# Patient Record
Sex: Female | Born: 2000 | Race: Black or African American | Hispanic: No | Marital: Single | Smoking: Never smoker
Health system: Southern US, Community
[De-identification: ages and names within clinical notes are randomized; demographics above are authoritative.]

## PROBLEM LIST (undated history)

## (undated) DIAGNOSIS — N289 Disorder of kidney and ureter, unspecified: Secondary | ICD-10-CM

## (undated) DIAGNOSIS — E119 Type 2 diabetes mellitus without complications: Secondary | ICD-10-CM

## (undated) DIAGNOSIS — N926 Irregular menstruation, unspecified: Secondary | ICD-10-CM

## (undated) HISTORY — PX: HYSTEROSCOPY: SHX211

---

## 2022-01-04 ENCOUNTER — Emergency Department (HOSPITAL_COMMUNITY): Payer: BC Managed Care – PPO

## 2022-01-04 ENCOUNTER — Encounter (HOSPITAL_COMMUNITY): Payer: Self-pay

## 2022-01-04 ENCOUNTER — Emergency Department (HOSPITAL_COMMUNITY)
Admission: EM | Admit: 2022-01-04 | Discharge: 2022-01-04 | Disposition: A | Discharge status: Home / Self Care | Payer: BC Managed Care – PPO | Attending: Emergency Medicine | Admitting: Emergency Medicine

## 2022-01-04 ENCOUNTER — Other Ambulatory Visit: Payer: Self-pay

## 2022-01-04 DIAGNOSIS — R55 Syncope and collapse: Secondary | ICD-10-CM | POA: Diagnosis not present

## 2022-01-04 DIAGNOSIS — R109 Unspecified abdominal pain: Secondary | ICD-10-CM | POA: Diagnosis present

## 2022-01-04 DIAGNOSIS — R112 Nausea with vomiting, unspecified: Secondary | ICD-10-CM | POA: Insufficient documentation

## 2022-01-04 DIAGNOSIS — R1012 Left upper quadrant pain: Secondary | ICD-10-CM | POA: Diagnosis not present

## 2022-01-04 DIAGNOSIS — R1032 Left lower quadrant pain: Secondary | ICD-10-CM | POA: Insufficient documentation

## 2022-01-04 HISTORY — DX: Disorder of kidney and ureter, unspecified: N28.9

## 2022-01-04 HISTORY — DX: Type 2 diabetes mellitus without complications: E11.9

## 2022-01-04 HISTORY — DX: Irregular menstruation, unspecified: N92.6

## 2022-01-04 LAB — BASIC METABOLIC PANEL
Anion gap: 11 (ref 5–15)
BUN: 10 mg/dL (ref 6–20)
CO2: 18 mmol/L — ABNORMAL LOW (ref 22–32)
Calcium: 9.3 mg/dL (ref 8.9–10.3)
Chloride: 106 mmol/L (ref 98–111)
Creatinine, Ser: 0.85 mg/dL (ref 0.44–1.00)
GFR, Estimated: >60 mL/min (ref >60)
Glucose, Bld: 130 mg/dL — ABNORMAL HIGH (ref 70–99)
Potassium: 3.1 mmol/L — ABNORMAL LOW (ref 3.5–5.1)
Sodium: 135 mmol/L (ref 135–145)

## 2022-01-04 LAB — CBC
HCT: 42.5 % (ref 36.0–46.0)
Hemoglobin: 14.1 g/dL (ref 12.0–15.0)
MCH: 31.5 pg (ref 26.0–34.0)
MCHC: 33.2 g/dL (ref 30.0–36.0)
MCV: 95.1 fL (ref 80.0–100.0)
Platelets: 361 10*3/uL (ref 150–400)
RBC: 4.47 MIL/uL (ref 3.87–5.11)
RDW: 12 % (ref 11.5–15.5)
WBC: 11.6 10*3/uL — ABNORMAL HIGH (ref 4.0–10.5)
nRBC: 0 % (ref 0.0–0.2)

## 2022-01-04 LAB — I-STAT BETA HCG BLOOD, ED (MC, WL, AP ONLY): I-stat hCG, quantitative: <5 m[IU]/mL (ref <5)

## 2022-01-04 LAB — URINALYSIS, ROUTINE W REFLEX MICROSCOPIC
Bacteria, UA: NONE SEEN
Bilirubin Urine: NEGATIVE
Glucose, UA: NEGATIVE mg/dL
Ketones, ur: 80 mg/dL — AB
Leukocytes,Ua: NEGATIVE
Nitrite: NEGATIVE
Protein, ur: NEGATIVE mg/dL
Specific Gravity, Urine: >1.046 — ABNORMAL HIGH (ref 1.005–1.030)
pH: 8 (ref 5.0–8.0)

## 2022-01-04 LAB — CBG MONITORING, ED: Glucose-Capillary: 112 mg/dL — ABNORMAL HIGH (ref 70–99)

## 2022-01-04 MED ORDER — METOCLOPRAMIDE HCL 5 MG/ML IJ SOLN
10.0000 mg | Freq: Once | INTRAMUSCULAR | Status: AC
  Administered 2022-01-04: 10 mg via INTRAVENOUS
  Filled 2022-01-04: qty 2

## 2022-01-04 MED ORDER — MORPHINE SULFATE (PF) 4 MG/ML IV SOLN
4.0000 mg | Freq: Once | INTRAVENOUS | Status: AC
  Administered 2022-01-04: 4 mg via INTRAVENOUS
  Filled 2022-01-04: qty 1

## 2022-01-04 MED ORDER — ONDANSETRON 4 MG PO TBDP
4.0000 mg | ORAL_TABLET | Freq: Once | ORAL | Status: AC | PRN
Start: 2022-01-04 — End: 2022-01-04
  Administered 2022-01-04: 4 mg via ORAL
  Filled 2022-01-04: qty 1

## 2022-01-04 MED ORDER — SODIUM CHLORIDE 0.9 % IV BOLUS
1000.0000 mL | Freq: Once | INTRAVENOUS | Status: AC
  Administered 2022-01-04: 1000 mL via INTRAVENOUS

## 2022-01-04 MED ORDER — ONDANSETRON HCL 4 MG PO TABS: 4.0000 mg | ORAL_TABLET | Freq: Three times a day (TID) | ORAL | 0 refills | Status: DC | PRN

## 2022-01-04 MED ORDER — IOHEXOL 300 MG/ML  SOLN
100.0000 mL | Freq: Once | INTRAMUSCULAR | Status: AC | PRN
  Administered 2022-01-04: 100 mL via INTRAVENOUS

## 2022-01-04 NOTE — ED Notes (Signed)
Pt attempted to provide urine sample, but unable to urinate.  ?

## 2022-01-04 NOTE — ED Notes (Signed)
Patient transported to CT 

## 2022-01-04 NOTE — ED Notes (Signed)
Pt ambulatory to restroom. Steady gait. No issues observed.  ?

## 2022-01-04 NOTE — Discharge Instructions (Signed)
You have been seen and discharged from the emergency department.  Your blood work showed mildly elevated white blood cells but otherwise was normal.  Urine was unremarkable.  CT scan of the abdomen pelvis did not show any acute/surgical finding.  You are most likely suffering from a viral stomach bug.  Stable hydrated, use antinausea medicine as needed.  Follow-up with your primary provider for further evaluation and further care. Take home medications as prescribed. If you have any worsening symptoms or further concerns for your health please return to an emergency department for further evaluation. ?

## 2022-01-04 NOTE — ED Provider Triage Note (Signed)
Emergency Medicine Provider Triage Evaluation Note ? ?Courtney Quinn , a 21 y.o. female  was evaluated in triage.  Pt complains of abdominal pain.  The patient states that she has had sharp abdominal pain and right flank pain for the past 5 days.  She endorses nausea and vomiting.  She denies shortness of breath or chest pain.  She denies urinary symptoms.  The patient states that she only has a right kidney ? ?Review of Systems  ?Positive: Abdominal pain, nausea, vomiting ?Negative: Chest pain, shortness of breath ? ?Physical Exam  ?BP 126/80 (BP Location: Left Arm)   Pulse 76   Temp 97.8 ?F (36.6 ?C) (Oral)   Resp 18   Ht 5\' 4"  (1.626 m)   Wt 77.1 kg   LMP 12/30/2021   SpO2 95% Comment: Simultaneous filing. User may not have seen previous data.  BMI 29.18 kg/m?  ?Gen:   Awake, patient actively vomiting/dry heaving ?Resp:  Normal effort  ?MSK:   Moves extremities without difficulty  ?Other:  Right CVA tenderness ? ?Medical Decision Making  ?Medically screening exam initiated at 2:06 PM.  Appropriate orders placed.  Courtney Quinn was informed that the remainder of the evaluation will be completed by another provider, this initial triage assessment does not replace that evaluation, and the importance of remaining in the ED until their evaluation is complete. ? ? ?  ?Dorothyann Peng, PA-C ?01/04/22 1407 ? ?

## 2022-01-04 NOTE — ED Provider Notes (Signed)
?[facility] DEPT ?Provider Note ? ? ?CSN: [id number] ?Arrival date & time: 01/04/22  1334 ? ?  ? ?History ? ?Chief Complaint  ?Patient presents with  ? Abdominal Pain  ? Nausea  ? Loss of Consciousness  ? ? ?Courtney Quinn is a 21 y.o. female. ? ?HPI ? ?21 year old female presents emergency department with mainly left-sided abdominal pain.  Patient states this has been intermittent for the past 5 days, worsening and more severe today.  She is now complaining of nausea/nonbloody vomiting.  Denies any diarrhea.  No shortness of breath or chest pain.  No urinary symptoms.  No reported fever.  She never had symptoms like this in the past.  She is currently on her menstrual cycle which is otherwise been baseline for her. ? ?Home Medications ?Prior to Admission medications   ?Not on File  ?   ? ?Allergies    ?Patient has no known allergies.   ? ?Review of Systems   ?Review of Systems  ?Constitutional:  Positive for appetite change and fatigue. Negative for fever.  ?Respiratory:  Negative for shortness of breath.   ?Cardiovascular:  Negative for chest pain.  ?Gastrointestinal:  Positive for abdominal pain, nausea and vomiting. Negative for diarrhea.  ?Genitourinary:  Negative for dysuria and menstrual problem.  ?Skin:  Negative for rash.  ?Neurological:  Negative for headaches.  ? ?Physical Exam ?Updated Vital Signs ?BP 132/65   Pulse 60   Temp 97.8 ?F (36.6 ?C)   Resp (!) 25   Ht 5\' 4"  (1.626 m)   Wt 77.1 kg   LMP 12/30/2021   SpO2 100%   BMI 29.18 kg/m?  ?Physical Exam ?Vitals and nursing note reviewed.  ?Constitutional:   ?   General: She is not in acute distress. ?   Appearance: Normal appearance.  ?HENT:  ?   Head: Normocephalic.  ?   Mouth/Throat:  ?   Mouth: Mucous membranes are moist.  ?Cardiovascular:  ?   Rate and Rhythm: Normal rate.  ?Pulmonary:  ?   Effort: Pulmonary effort is normal. No respiratory distress.  ?Abdominal:  ?   General: Bowel sounds are normal. There  is no distension.  ?   Palpations: Abdomen is soft.  ?   Tenderness: There is abdominal tenderness in the periumbilical area, left upper quadrant and left lower quadrant. There is no guarding or rebound.  ?Skin: ?   General: Skin is warm.  ?Neurological:  ?   Mental Status: She is alert and oriented to person, place, and time. Mental status is at baseline.  ?Psychiatric:     ?   Mood and Affect: Mood normal.  ? ? ?ED Results / Procedures / Treatments   ?Labs ?(all labs ordered are listed, but only abnormal results are displayed) ?Labs Reviewed  ?BASIC METABOLIC PANEL - Abnormal; Notable for the following components:  ?    Result Value  ? Potassium 3.1 (*)   ? CO2 18 (*)   ? Glucose, Bld 130 (*)   ? All other components within normal limits  ?CBC - Abnormal; Notable for the following components:  ? WBC 11.6 (*)   ? All other components within normal limits  ?CBG MONITORING, ED - Abnormal; Notable for the following components:  ? Glucose-Capillary 112 (*)   ? All other components within normal limits  ?URINALYSIS, ROUTINE W REFLEX MICROSCOPIC  ?I-STAT BETA HCG BLOOD, ED ([facility], [facility], [facility] ONLY)  ? ? ?EKG ?EKG Interpretation ? ?Date/Time:  Friday January 04 2022 14:04:06 EDT ?Ventricular Rate:  79 ?PR Interval:  196 ?QRS Duration: 103 ?QT Interval:  386 ?QTC Calculation: 443 ?R Axis:   64 ?Text Interpretation: Sinus rhythm Borderline T abnormalities, anterior leads Confirmed by Lavenia Atlas [phone]) on 01/04/2022 3:49:32 PM ? ?Radiology ?No results found. ? ?Procedures ?Procedures  ? ? ?Medications Ordered in ED ?Medications  ?ondansetron (ZOFRAN-ODT) disintegrating tablet 4 mg (4 mg Oral Given 01/04/22 1405)  ?sodium chloride 0.9 % bolus 1,000 mL (1,000 mLs Intravenous New Bag/Given 01/04/22 1555)  ?morphine (PF) 4 MG/ML injection 4 mg (4 mg Intravenous Given 01/04/22 1554)  ?metoCLOPramide (REGLAN) injection 10 mg (10 mg Intravenous Given 01/04/22 1646)  ?iohexol (OMNIPAQUE) 300 MG/ML solution 100 mL (100 mLs Intravenous Contrast  Given 01/04/22 1700)  ? ? ?ED Course/ Medical Decision Making/ A&P ?  ?                        ?Medical Decision Making ?Amount and/or Complexity of Data Reviewed ?Labs: ordered. ?Radiology: ordered. ? ?Risk ?Prescription drug management. ? ? ?21 year old female presents emergency department abdominal pain, nausea/vomiting.  Vitals are normal on arrival, abdomen is tender mainly on the left side but benign.  Blood work shows a white blood cell count of 11.6, mild hypokalemia, urinalysis with blood in it most likely secondary to menstruation. ? ?CT of the abdomen pelvis identifies no acute pathology, known single right sided kidney with no abnormal findings.  After IV medication patient's symptoms have resolved, she been able to p.o.  Plan for outpatient symptomatic treatment and follow-up.  Patient at this time appears safe and stable for discharge and close outpatient follow up. Discharge plan and strict return to ED precautions discussed, patient verbalizes understanding and agreement. ? ? ? ? ? ? ? ?Final Clinical Impression(s) / ED Diagnoses ?Final diagnoses:  ?None  ? ? ?Rx / DC Orders ?ED Discharge Orders   ? ? None  ? ?  ? ? ?  ?Lorelle Gibbs, DO ?01/04/22 2011 ? ?

## 2022-01-04 NOTE — ED Triage Notes (Signed)
Per EMS- patient reports abdominal pain, nausea, and a syncopal episode today. Patient also reports that she is on her period. ?

## 2022-02-12 ENCOUNTER — Encounter (HOSPITAL_COMMUNITY): Payer: Self-pay

## 2022-02-12 ENCOUNTER — Emergency Department (HOSPITAL_COMMUNITY)
Admission: EM | Admit: 2022-02-12 | Discharge: 2022-02-13 | Disposition: A | Discharge status: Home / Self Care | Payer: BC Managed Care – PPO | Attending: Emergency Medicine | Admitting: Emergency Medicine

## 2022-02-12 ENCOUNTER — Other Ambulatory Visit: Payer: Self-pay

## 2022-02-12 DIAGNOSIS — D72829 Elevated white blood cell count, unspecified: Secondary | ICD-10-CM | POA: Insufficient documentation

## 2022-02-12 DIAGNOSIS — E119 Type 2 diabetes mellitus without complications: Secondary | ICD-10-CM | POA: Diagnosis not present

## 2022-02-12 DIAGNOSIS — R1030 Lower abdominal pain, unspecified: Secondary | ICD-10-CM | POA: Diagnosis present

## 2022-02-12 DIAGNOSIS — N390 Urinary tract infection, site not specified: Secondary | ICD-10-CM | POA: Diagnosis not present

## 2022-02-12 LAB — URINALYSIS, ROUTINE W REFLEX MICROSCOPIC
Bilirubin Urine: NEGATIVE
Glucose, UA: NEGATIVE mg/dL
Ketones, ur: NEGATIVE mg/dL
Nitrite: NEGATIVE
Protein, ur: 100 mg/dL — AB
RBC / HPF: >50 RBC/hpf — ABNORMAL HIGH (ref 0–5)
Specific Gravity, Urine: 1.018 (ref 1.005–1.030)
WBC, UA: >50 WBC/hpf — ABNORMAL HIGH (ref 0–5)
pH: 7 (ref 5.0–8.0)

## 2022-02-12 LAB — PREGNANCY, URINE: Preg Test, Ur: NEGATIVE

## 2022-02-12 NOTE — ED Provider Notes (Signed)
?[facility] DEPT ?Provider Note ? ? ?CSN: [id number] ?Arrival date & time: 02/12/22  2206 ? ?  ? ?History ? ?Chief Complaint  ?Patient presents with  ? Hematuria  ? burning with urination  ? ? ?Courtney Quinn is a 21 y.o. female. ? ?Courtney Quinn is a 21 y.o. female with hx of DM and congenital solitary kidney, who presents for evaluation of dysuria, urinary frequency, hematuria, lower abdominal and flank pain.  She reports that symptoms started yesterday and have progressively worsened throughout the day.  She reports blood in her urine and burning and pain with urination and that she has had very frequent urination.  Reports some suprapubic abdominal pain that radiates to her back as well as some mild right-sided flank pain.  She has a solitary right kidney.  Denies any prior history of kidney stones.  Reports some associated nausea but no vomiting.  No known fevers.  No meds prior to arrival. ? ?The history is provided by the patient.  ?Hematuria ?Associated symptoms include abdominal pain. Pertinent negatives include no chest pain and no shortness of breath.  ? ?  ? ?Home Medications ?Prior to Admission medications   ?Medication Sig Start Date End Date Taking? Authorizing Provider  ?cephALEXin (KEFLEX) 500 MG capsule Take 1 capsule (500 mg total) by mouth 4 (four) times daily. 02/13/22  Yes Jacqlyn Larsen, PA-C  ?ondansetron (ZOFRAN) 4 MG tablet Take 1 tablet (4 mg total) by mouth every 6 (six) hours. 02/13/22  Yes Jacqlyn Larsen, PA-C  ?   ? ?Allergies    ?Patient has no known allergies.   ? ?Review of Systems   ?Review of Systems  ?Constitutional:  Positive for chills. Negative for fever.  ?HENT: Negative.    ?Respiratory:  Negative for cough and shortness of breath.   ?Cardiovascular:  Negative for chest pain.  ?Gastrointestinal:  Positive for abdominal pain and nausea. Negative for diarrhea and vomiting.  ?Genitourinary:  Positive for dysuria, flank pain, frequency and  hematuria. Negative for vaginal bleeding and vaginal discharge.  ?Musculoskeletal:  Negative for arthralgias and myalgias.  ?Skin:  Negative for color change and rash.  ?Neurological:  Negative for dizziness, syncope and light-headedness.  ?All other systems reviewed and are negative. ? ?Physical Exam ?Updated Vital Signs ?BP 120/87 (BP Location: Right Arm)   Pulse (!) 56   Temp 98.2 ?F (36.8 ?C) (Oral)   Resp 18   Ht 5\' 4"  (1.626 m)   Wt 77.1 kg   SpO2 100%   BMI 29.18 kg/m?  ?Physical Exam ?Vitals and nursing note reviewed.  ?Constitutional:   ?   General: She is not in acute distress. ?   Appearance: Normal appearance. She is well-developed and normal weight. She is not ill-appearing or diaphoretic.  ?HENT:  ?   Head: Normocephalic and atraumatic.  ?Eyes:  ?   General:     ?   Right eye: No discharge.     ?   Left eye: No discharge.  ?Cardiovascular:  ?   Rate and Rhythm: Normal rate and regular rhythm.  ?   Pulses: Normal pulses.  ?   Heart sounds: Normal heart sounds.  ?Pulmonary:  ?   Effort: Pulmonary effort is normal. No respiratory distress.  ?   Breath sounds: Normal breath sounds. No wheezing or rales.  ?   Comments: Respirations equal and unlabored, patient able to speak in full sentences, lungs clear to auscultation bilaterally  ?Abdominal:  ?  General: Bowel sounds are normal. There is no distension.  ?   Palpations: Abdomen is soft. There is no mass.  ?   Tenderness: There is abdominal tenderness. There is no guarding.  ?   Comments: Abdomen soft, nondistended, bowel sounds present, there is a very mild suprapubic tenderness but otherwise no acute abdominal tenderness, no guarding or rebound tenderness, patient with very mild right flank tenderness but no significant CVA tenderness  ?Musculoskeletal:     ?   General: No deformity.  ?   Cervical back: Neck supple.  ?Skin: ?   General: Skin is warm and dry.  ?   Capillary Refill: Capillary refill takes less than 2 seconds.  ?Neurological:  ?    Mental Status: She is alert and oriented to person, place, and time.  ?   Coordination: Coordination normal.  ?   Comments: Speech is clear, able to follow commands ?Moves extremities without ataxia, coordination intact  ?Psychiatric:     ?   Mood and Affect: Mood normal.     ?   Behavior: Behavior normal.  ? ? ?ED Results / Procedures / Treatments   ?Labs ?(all labs ordered are listed, but only abnormal results are displayed) ?Labs Reviewed  ?URINALYSIS, ROUTINE W REFLEX MICROSCOPIC - Abnormal; Notable for the following components:  ?    Result Value  ? APPearance CLOUDY (*)   ? Hgb urine dipstick LARGE (*)   ? Protein, ur 100 (*)   ? Leukocytes,Ua LARGE (*)   ? RBC / HPF >50 (*)   ? WBC, UA >50 (*)   ? Bacteria, UA MANY (*)   ? All other components within normal limits  ?CBC WITH DIFFERENTIAL/PLATELET - Abnormal; Notable for the following components:  ? WBC 14.7 (*)   ? Neutro Abs 8.8 (*)   ? Lymphs Abs 4.4 (*)   ? All other components within normal limits  ?URINE CULTURE  ?PREGNANCY, URINE  ?BASIC METABOLIC PANEL  ?POC URINE PREG, ED  ? ? ?EKG ?None ? ?Radiology ?CT Renal Stone Study ? ?Result Date: 02/13/2022 ?CLINICAL DATA:  Low back pain, right flank pain, dysuria EXAM: CT ABDOMEN AND PELVIS WITHOUT CONTRAST TECHNIQUE: Multidetector CT imaging of the abdomen and pelvis was performed following the standard protocol without IV contrast. RADIATION DOSE REDUCTION: This exam was performed according to the departmental dose-optimization program which includes automated exposure control, adjustment of the mA and/or kV according to patient size and/or use of iterative reconstruction technique. COMPARISON:  01/04/2022 FINDINGS: Lower chest: No acute abnormality. Hepatobiliary: Visualized liver is unremarkable. Gallbladder unremarkable. No intra or extrahepatic biliary ductal dilation. Pancreas: Unremarkable. Spleen: Visualized spleen is unremarkable. Adrenals/Urinary Tract: The adrenal glands are unremarkable. Right  kidney is unremarkable. Left kidney is absent. Bladder is unremarkable. Stomach/Bowel: Stomach is within normal limits. Appendix appears normal. No evidence of bowel wall thickening, distention, or inflammatory changes. Vascular/Lymphatic: No significant vascular findings are present. No enlarged abdominal or pelvic lymph nodes. 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 No adnexal masses. Other: No abdominal wall hernia or abnormality. No abdominopelvic ascites. Musculoskeletal: No acute bone abnormality. No lytic or blastic bone lesion. IMPRESSION: No acute intra-abdominal pathology identified. No definite radiographic explanation for the patient's reported symptoms. Absence of the left kidney again noted. Bike are no it appearance of the uterus. 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 Electronically Signed   By: Fidela Salisbury M.D.   On: 02/13/2022 01:24   ? ?Procedures ?Procedures  ? ? ?Medications Ordered in ED ?Medications  ?sodium chloride 0.9 % bolus 1,000 mL (0 mLs Intravenous Stopped 02/13/22 0230)  ?ondansetron [facility]) injection 4 mg (4 mg Intravenous Given 02/13/22 0019)  ?acetaminophen (TYLENOL) tablet 1,000 mg (1,000 mg Oral Given 02/13/22 0020)  ?cefTRIAXone (ROCEPHIN) 1 g in sodium chloride 0.9 % 100 mL IVPB (0 g Intravenous Stopped 02/13/22 0051)  ? ? ?ED Course/ Medical Decision Making/ A&P ?  ?                        ?Medical Decision Making ?Amount and/or Complexity of Data Reviewed ?Labs: ordered. ?Radiology: ordered. ? ?Risk ?OTC drugs. ?Prescription drug management. ? ? ?21 y.o. female presents to the ED with complaints of dysuria, frequency, hematuria, abdominal and flank pain, this involves an extensive number of treatment options, and is a complaint  that carries with it a high risk of complications and morbidity.  The differential diagnosis includes UTI, pyelonephritis, nephrolithiasis, STD  ?On arrival pt is nontoxic, vitals without significant abnormality

## 2022-02-12 NOTE — ED Triage Notes (Signed)
Pt reports with burning with urination, hematuria, lower abdominal pain, and back pain since yesterday.  ?

## 2022-02-12 NOTE — ED Notes (Signed)
Lab called to add pregnancy urine. ?

## 2022-02-13 ENCOUNTER — Emergency Department (HOSPITAL_COMMUNITY): Payer: BC Managed Care – PPO

## 2022-02-13 LAB — BASIC METABOLIC PANEL
Anion gap: 5 (ref 5–15)
BUN: 10 mg/dL (ref 6–20)
CO2: 26 mmol/L (ref 22–32)
Calcium: 8.9 mg/dL (ref 8.9–10.3)
Chloride: 105 mmol/L (ref 98–111)
Creatinine, Ser: 0.83 mg/dL (ref 0.44–1.00)
GFR, Estimated: >60 mL/min (ref >60)
Glucose, Bld: 82 mg/dL (ref 70–99)
Potassium: 4.2 mmol/L (ref 3.5–5.1)
Sodium: 136 mmol/L (ref 135–145)

## 2022-02-13 LAB — CBC WITH DIFFERENTIAL/PLATELET
Abs Immature Granulocytes: 0.04 10*3/uL (ref 0.00–0.07)
Basophils Absolute: 0.1 10*3/uL (ref 0.0–0.1)
Basophils Relative: 0 %
Eosinophils Absolute: 0.4 10*3/uL (ref 0.0–0.5)
Eosinophils Relative: 3 %
HCT: 39.2 % (ref 36.0–46.0)
Hemoglobin: 12.4 g/dL (ref 12.0–15.0)
Immature Granulocytes: 0 %
Lymphocytes Relative: 30 %
Lymphs Abs: 4.4 10*3/uL — ABNORMAL HIGH (ref 0.7–4.0)
MCH: 30.9 pg (ref 26.0–34.0)
MCHC: 31.6 g/dL (ref 30.0–36.0)
MCV: 97.8 fL (ref 80.0–100.0)
Monocytes Absolute: 1 10*3/uL (ref 0.1–1.0)
Monocytes Relative: 7 %
Neutro Abs: 8.8 10*3/uL — ABNORMAL HIGH (ref 1.7–7.7)
Neutrophils Relative %: 60 %
Platelets: 300 10*3/uL (ref 150–400)
RBC: 4.01 MIL/uL (ref 3.87–5.11)
RDW: 12.2 % (ref 11.5–15.5)
WBC: 14.7 10*3/uL — ABNORMAL HIGH (ref 4.0–10.5)
nRBC: 0 % (ref 0.0–0.2)

## 2022-02-13 MED ORDER — ACETAMINOPHEN 500 MG PO TABS
1000.0000 mg | ORAL_TABLET | Freq: Once | ORAL | Status: AC
  Administered 2022-02-13: 1000 mg via ORAL
  Filled 2022-02-13: qty 2

## 2022-02-13 MED ORDER — ONDANSETRON HCL 4 MG PO TABS: 4.0000 mg | ORAL_TABLET | Freq: Four times a day (QID) | ORAL | 0 refills | Status: AC

## 2022-02-13 MED ORDER — CEPHALEXIN 500 MG PO CAPS: 500.0000 mg | ORAL_CAPSULE | Freq: Four times a day (QID) | ORAL | 0 refills | Status: DC

## 2022-02-13 MED ORDER — SODIUM CHLORIDE 0.9 % IV SOLN
1.0000 g | Freq: Once | INTRAVENOUS | Status: AC
  Administered 2022-02-13: 1 g via INTRAVENOUS
  Filled 2022-02-13: qty 10

## 2022-02-13 MED ORDER — SODIUM CHLORIDE 0.9 % IV BOLUS
1000.0000 mL | Freq: Once | INTRAVENOUS | Status: AC
  Administered 2022-02-13: 1000 mL via INTRAVENOUS

## 2022-02-13 MED ORDER — ONDANSETRON HCL 4 MG/2ML IJ SOLN
4.0000 mg | Freq: Once | INTRAMUSCULAR | Status: AC
  Administered 2022-02-13: 4 mg via INTRAVENOUS
  Filled 2022-02-13: qty 2

## 2022-02-13 NOTE — ED Notes (Signed)
Discharge instructions reviewed, questions answered. Rx education provided. Pt states understanding and no further questions. Pt ambulatory with steady gait upon discharge. No s/s of distress noted. ? ?

## 2022-02-13 NOTE — Discharge Instructions (Addendum)
You have a urinary tract infection, please take prescribed antibiotics 4 times daily for the next 7 days.  It is very important that you complete entire course of antibiotics even if your symptoms are improving.  You may use prescribed Zofran as needed for nausea or vomiting.  For pain you can take Tylenol and/or ibuprofen every 6 hours as needed. ? ?If you develop fevers, persistent vomiting and cannot keep down antibiotics or worsening pain you should return for reevaluation. ?

## 2022-02-13 NOTE — ED Notes (Signed)
Lab called for urine culture ?

## 2022-02-15 LAB — URINE CULTURE: Culture: >=100000 — AB

## 2022-02-16 ENCOUNTER — Telehealth (HOSPITAL_BASED_OUTPATIENT_CLINIC_OR_DEPARTMENT_OTHER): Payer: Self-pay | Admitting: *Deleted

## 2022-02-16 NOTE — Telephone Encounter (Signed)
Post ED Visit - Positive Culture Follow-up ? ?Culture report reviewed by antimicrobial stewardship pharmacist: ?[facility] ?[]  , Pharm.D. ?[]  Enzo Bi, Pharm.D., BCPS AQ-ID ?[]  , Pharm.D., BCPS ?[]  Celedonio Miyamoto, Pharm.D., BCPS ?[]  [place], Garvin Fila.D., BCPS, AAHIVP ?[]  , Pharm.D., BCPS, AAHIVP ?[]  Georgina Pillion, PharmD, BCPS ?[]  , PharmD, BCPS ?[]  [place], PharmD, BCPS ?[]  [address], PharmD ?[]  , PharmD, BCPS ?[]  Estella Husk, PharmD ? ? [facility] ?[]  Lysle Pearl, PharmD ?[]  , PharmD ?[]  Phillips Climes, PharmD ?[]  , Rph ?[]  Agapito Games) , PharmD ?[]  Verlan Friends, PharmD ?[]  , PharmD ?[]  Mervyn Gay, PharmD ?[]  , PharmD ?[]  Vinnie Level, PharmD ?[x]  Gerri Spore, PharmD ?[]  , PharmD ?[]  Len Childs, PharmD ? ? ?Positive urine culture ?Treated with Cephalexin organism sensitive to the same and no further patient follow-up is required at this time. ? ? ?02/16/2022, 12:12 PM ?  ?

## 2022-02-21 ENCOUNTER — Encounter (HOSPITAL_COMMUNITY): Payer: Self-pay

## 2022-02-21 ENCOUNTER — Other Ambulatory Visit: Payer: Self-pay

## 2022-02-21 ENCOUNTER — Emergency Department (HOSPITAL_COMMUNITY)
Admission: EM | Admit: 2022-02-21 | Discharge: 2022-02-22 | Disposition: A | Discharge status: Home / Self Care | Payer: BC Managed Care – PPO | Attending: Emergency Medicine | Admitting: Emergency Medicine

## 2022-02-21 DIAGNOSIS — R1031 Right lower quadrant pain: Secondary | ICD-10-CM | POA: Diagnosis present

## 2022-02-21 DIAGNOSIS — R1030 Lower abdominal pain, unspecified: Secondary | ICD-10-CM

## 2022-02-21 DIAGNOSIS — R1032 Left lower quadrant pain: Secondary | ICD-10-CM | POA: Diagnosis not present

## 2022-02-21 DIAGNOSIS — E119 Type 2 diabetes mellitus without complications: Secondary | ICD-10-CM | POA: Diagnosis not present

## 2022-02-21 DIAGNOSIS — Z87718 Personal history of other specified (corrected) congenital malformations of genitourinary system: Secondary | ICD-10-CM

## 2022-02-21 LAB — COMPREHENSIVE METABOLIC PANEL
ALT: 16 U/L (ref 0–44)
AST: 20 U/L (ref 15–41)
Albumin: 4 g/dL (ref 3.5–5.0)
Alkaline Phosphatase: 43 U/L (ref 38–126)
Anion gap: 4 — ABNORMAL LOW (ref 5–15)
BUN: 9 mg/dL (ref 6–20)
CO2: 24 mmol/L (ref 22–32)
Calcium: 8.8 mg/dL — ABNORMAL LOW (ref 8.9–10.3)
Chloride: 109 mmol/L (ref 98–111)
Creatinine, Ser: 0.93 mg/dL (ref 0.44–1.00)
GFR, Estimated: >60 mL/min (ref >60)
Glucose, Bld: 96 mg/dL (ref 70–99)
Potassium: 3.5 mmol/L (ref 3.5–5.1)
Sodium: 137 mmol/L (ref 135–145)
Total Bilirubin: 0.7 mg/dL (ref 0.3–1.2)
Total Protein: 7.8 g/dL (ref 6.5–8.1)

## 2022-02-21 LAB — CBG MONITORING, ED: Glucose-Capillary: 88 mg/dL (ref 70–99)

## 2022-02-21 LAB — CBC
HCT: 38.8 % (ref 36.0–46.0)
Hemoglobin: 12.7 g/dL (ref 12.0–15.0)
MCH: 31.1 pg (ref 26.0–34.0)
MCHC: 32.7 g/dL (ref 30.0–36.0)
MCV: 95.1 fL (ref 80.0–100.0)
Platelets: 340 10*3/uL (ref 150–400)
RBC: 4.08 MIL/uL (ref 3.87–5.11)
RDW: 12 % (ref 11.5–15.5)
WBC: 10.1 10*3/uL (ref 4.0–10.5)
nRBC: 0 % (ref 0.0–0.2)

## 2022-02-21 LAB — LIPASE, BLOOD: Lipase: 37 U/L (ref 11–51)

## 2022-02-21 LAB — I-STAT BETA HCG BLOOD, ED (MC, WL, AP ONLY): I-stat hCG, quantitative: <5 m[IU]/mL (ref <5)

## 2022-02-21 NOTE — ED Triage Notes (Signed)
Patient said she has been nauseous for a month and vomiting all week. Is not sure if she is pregnant. Her pain is all over the stomach but sometimes to the left side. Type 1 diabetic. Said her last sugar was 74.

## 2022-02-21 NOTE — ED Provider Triage Note (Signed)
Emergency Medicine Provider Triage Evaluation Note  Courtney Quinn , a 21 y.o. female  was evaluated in triage.  Pt complains of nausea and abdominal pain over the last month.  Last week and vomiting as well.  Pain is sharp and comes and goes.  Denies urinary symptoms, changes in her bowel movements, back pain, Fever, diarrhea, headache, vision changes, recent illness contact with persons with similar symptoms.  LMP 3 weeks ago.  Recently had sexual intercourse without protection.  Currently on intimate partner.  No Hx of pregnancy or abdominal surgeries.  Hx of diabetes mellitus type 2 and D&C within the last 3 years.  Review of Systems  Positive:  Negative: As above  Physical Exam  BP (!) 119/101 (BP Location: Left Arm)   Pulse 86   Temp 98.2 F (36.8 C) (Oral)   Resp 16   Ht 5\' 4"  (1.626 m)   Wt 75.8 kg   SpO2 100%   BMI 28.67 kg/m  Gen:   Awake, no distress, anxious appearing Resp:  Normal effort, CTAB MSK:   Moves extremities without difficulty  Other:  Mild RLQ tenderness, moderate suprapubic tenderness.  Abdomen soft.  Upper extremities neurovascularly intact.  No active vomiting.  Medical Decision Making  Medically screening exam initiated at 8:42 PM.  Appropriate orders placed.  Courtney Quinn was informed that the remainder of the evaluation will be completed by another provider, this initial triage assessment does not replace that evaluation, and the importance of remaining in the ED until their evaluation is complete.  Labs ordered   , Courtney Quinn 02/21/22 2053

## 2022-02-22 ENCOUNTER — Encounter (HOSPITAL_COMMUNITY): Payer: Self-pay

## 2022-02-22 ENCOUNTER — Emergency Department (HOSPITAL_COMMUNITY): Payer: BC Managed Care – PPO

## 2022-02-22 MED ORDER — SODIUM CHLORIDE 0.9 % IV BOLUS
1000.0000 mL | Freq: Once | INTRAVENOUS | Status: AC
  Administered 2022-02-22: 1000 mL via INTRAVENOUS

## 2022-02-22 MED ORDER — ONDANSETRON HCL 4 MG/2ML IJ SOLN
4.0000 mg | Freq: Once | INTRAMUSCULAR | Status: AC
  Administered 2022-02-22: 4 mg via INTRAVENOUS
  Filled 2022-02-22: qty 2

## 2022-02-22 MED ORDER — KETOROLAC TROMETHAMINE 30 MG/ML IJ SOLN
30.0000 mg | Freq: Once | INTRAMUSCULAR | Status: AC
  Administered 2022-02-22: 30 mg via INTRAVENOUS
  Filled 2022-02-22: qty 1

## 2022-02-22 MED ORDER — TRAMADOL HCL 50 MG PO TABS: 50.0000 mg | ORAL_TABLET | Freq: Four times a day (QID) | ORAL | 0 refills | Status: DC | PRN

## 2022-02-22 MED ORDER — IOHEXOL 300 MG/ML  SOLN
100.0000 mL | Freq: Once | INTRAMUSCULAR | Status: AC | PRN
  Administered 2022-02-22: 100 mL via INTRAVENOUS

## 2022-02-22 MED ORDER — ONDANSETRON 8 MG PO TBDP: ORAL_TABLET | ORAL | 0 refills | Status: AC

## 2022-02-22 NOTE — ED Provider Notes (Signed)
[facility]-EMERGENCY DEPT Provider Note   CSN: [id number] Arrival date & time: 02/21/22  1925     History  Chief Complaint  Patient presents with   Abdominal Pain    Courtney Quinn is a 21 y.o. female.  Patient is a 21 year old female with past medical history of diabetes, bicornuate uterus.  Patient presenting today with complaints of lower abdominal pain.  She reports feeling nauseated for the past several weeks, then began having lower abdominal pain 1 week ago.  She describes intermittent crampy pain to the lower abdomen.  She denies having any fevers or chills.  She denies any abnormal bleeding, discharge, or urinary complaints.  Pain is worse with movement and palpation.  There are no alleviating factors.  The history is provided by the patient.      Home Medications Prior to Admission medications   Medication Sig Start Date End Date Taking? Authorizing Provider  cephALEXin (KEFLEX) 500 MG capsule Take 1 capsule (500 mg total) by mouth 4 (four) times daily. 02/13/22   Dartha Lodge, PA-C  ondansetron (ZOFRAN) 4 MG tablet Take 1 tablet (4 mg total) by mouth every 6 (six) hours. 02/13/22   Dartha Lodge, PA-C      Allergies    Patient has no known allergies.    Review of Systems   Review of Systems  All other systems reviewed and are negative.  Physical Exam Updated Vital Signs BP (!) 121/95 (BP Location: Left Arm)   Pulse 78   Temp 98 F (36.7 C)   Resp 12   Ht 5\' 4"  (1.626 m)   Wt 75.8 kg   SpO2 100%   BMI 28.67 kg/m  Physical Exam Vitals and nursing note reviewed.  Constitutional:      General: She is not in acute distress.    Appearance: She is well-developed. She is not diaphoretic.  HENT:     Head: Normocephalic and atraumatic.  Cardiovascular:     Rate and Rhythm: Normal rate and regular rhythm.     Heart sounds: No murmur heard.   No friction rub. No gallop.  Pulmonary:     Effort: Pulmonary effort is normal. No  respiratory distress.     Breath sounds: Normal breath sounds. No wheezing.  Abdominal:     General: Bowel sounds are normal. There is no distension.     Palpations: Abdomen is soft.     Tenderness: There is abdominal tenderness in the right lower quadrant, suprapubic area and left lower quadrant. There is no right CVA tenderness, left CVA tenderness, guarding or rebound.  Musculoskeletal:        General: Normal range of motion.     Cervical back: Normal range of motion and neck supple.  Skin:    General: Skin is warm and dry.  Neurological:     General: No focal deficit present.     Mental Status: She is alert and oriented to person, place, and time.    ED Results / Procedures / Treatments   Labs (all labs ordered are listed, but only abnormal results are displayed) Labs Reviewed  COMPREHENSIVE METABOLIC PANEL - Abnormal; Notable for the following components:      Result Value   Calcium 8.8 (*)    Anion gap 4 (*)    All other components within normal limits  LIPASE, BLOOD  CBC  URINALYSIS, ROUTINE W REFLEX MICROSCOPIC  I-STAT BETA HCG BLOOD, ED ([facility], [facility], [facility] ONLY)  CBG MONITORING, ED  EKG None  Radiology No results found.  Procedures Procedures    Medications Ordered in ED Medications  sodium chloride 0.9 % bolus 1,000 mL (has no administration in time range)  ketorolac (TORADOL) 30 MG/ML injection 30 mg (has no administration in time range)  ondansetron (ZOFRAN) injection 4 mg (has no administration in time range)    ED Course/ Medical Decision Making/ A&P  Patient presenting here with complaints of abdominal pain as described in the HPI.  I am uncertain as to the etiology of this pain, however nothing appears emergent.  Laboratory studies are reassuring and CT scan shows no acute intra-abdominal process.  Patient given IV fluids along with Toradol and Zofran and seems to be feeling better.  Patient seems appropriate for outpatient follow-up with her GYN and as  needed return.  Final Clinical Impression(s) / ED Diagnoses Final diagnoses:  None    Rx / DC Orders ED Discharge Orders     None         Geoffery Lyons, MD 02/22/22 [phone]

## 2022-02-22 NOTE — Discharge Instructions (Signed)
Follow-up with your GYN if not feeling better in the next few days.  Take tramadol as prescribed as needed for pain and Zofran as prescribed as needed for nausea.  Return to the emergency department if symptoms significantly worsen or change.

## 2022-04-03 ENCOUNTER — Other Ambulatory Visit: Payer: Self-pay | Admitting: Obstetrics and Gynecology

## 2022-06-05 ENCOUNTER — Encounter (HOSPITAL_BASED_OUTPATIENT_CLINIC_OR_DEPARTMENT_OTHER): Payer: Self-pay

## 2022-06-05 ENCOUNTER — Ambulatory Visit (HOSPITAL_BASED_OUTPATIENT_CLINIC_OR_DEPARTMENT_OTHER): Admit: 2022-06-05 | Payer: Self-pay | Admitting: Obstetrics and Gynecology

## 2022-06-05 SURGERY — EXCISION, ENDOMETRIOSIS, LAPAROSCOPIC
Anesthesia: General

## 2022-10-16 IMAGING — CT CT RENAL STONE PROTOCOL
2 of 4 series · 16 of 46 positions shown, 18 images · non-contrast
Comparison: 01/04/2022

CLINICAL DATA: Low back pain, right flank pain, dysuria



[Series 2: axial st · axial · 0.64mm/px · z∈[-434,-88]mm · 13 of 77 slices shown, 15 images]
[im 4/77  soft-tissue]
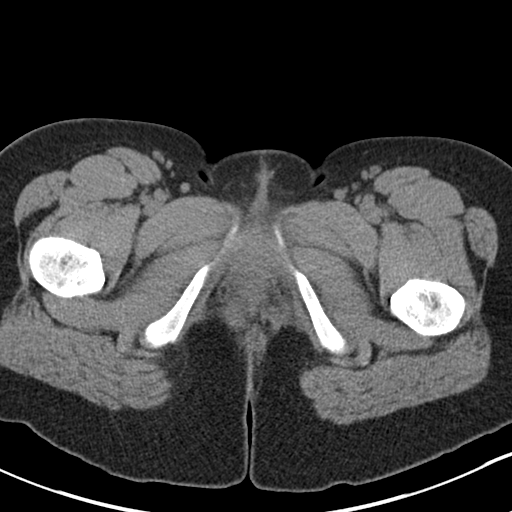
[im 4/77  bone]
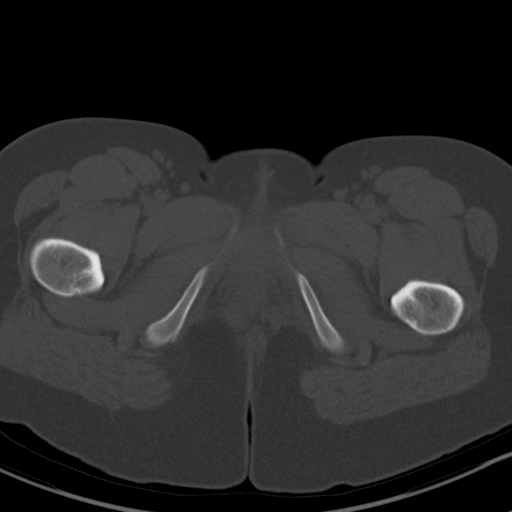
[im 10/77  soft-tissue]
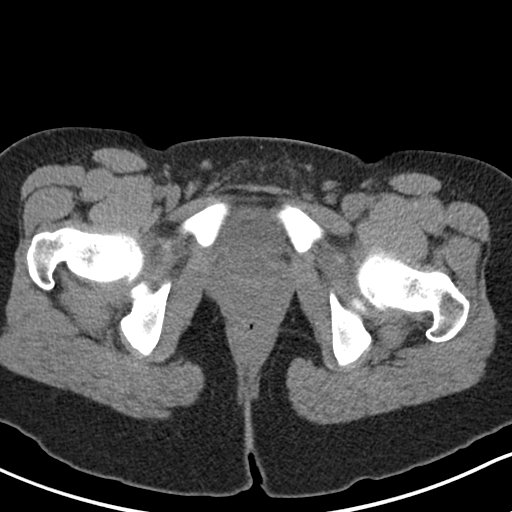
[im 16/77  soft-tissue]
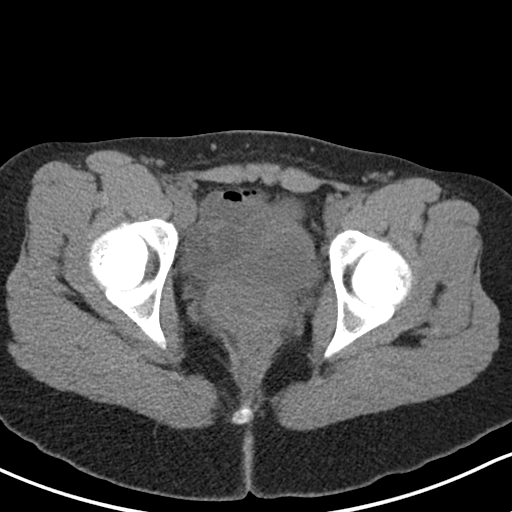
[im 23/77  soft-tissue]
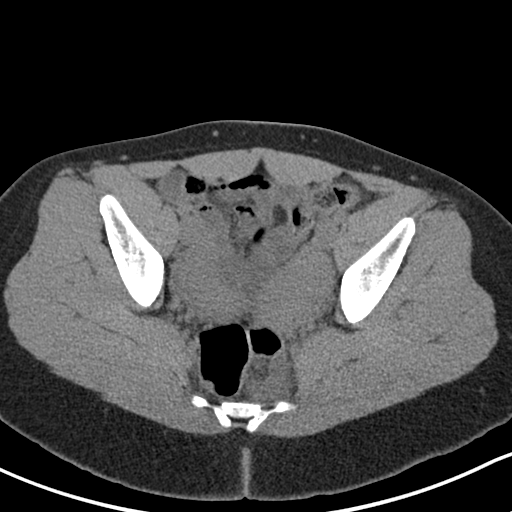
[im 26/77  soft-tissue]
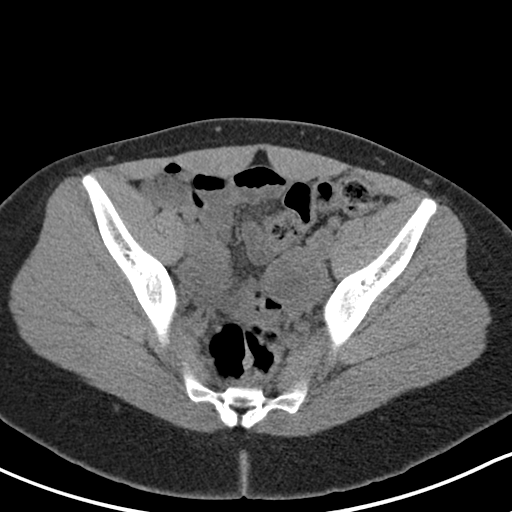
[im 32/77  soft-tissue]
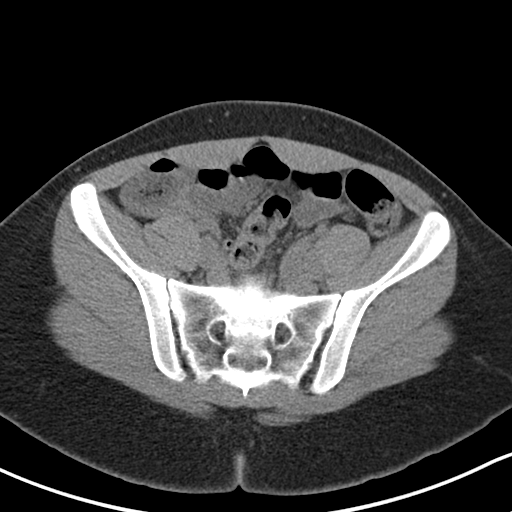
[im 39/77  soft-tissue]
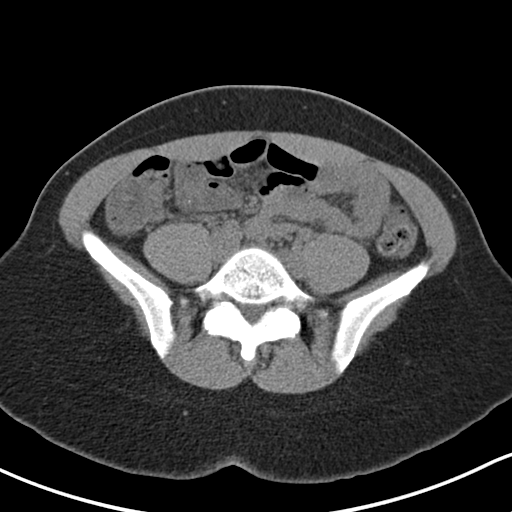
[im 45/77  soft-tissue]
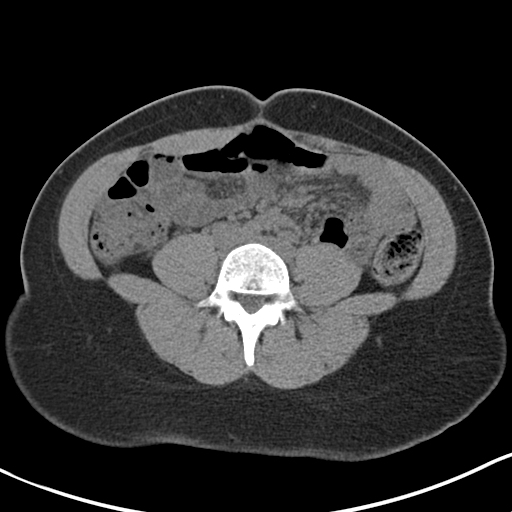
[im 51/77  soft-tissue]
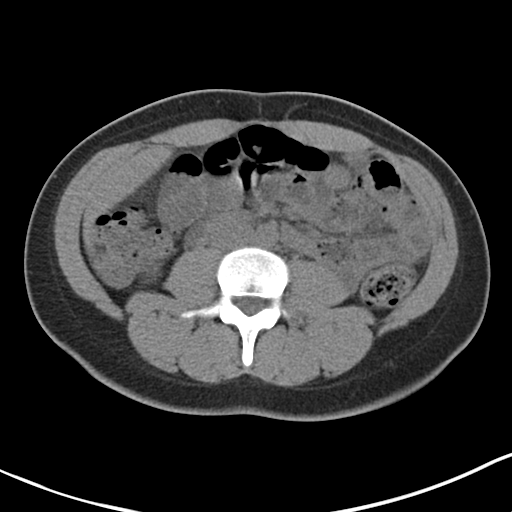
[im 51/77  bone]
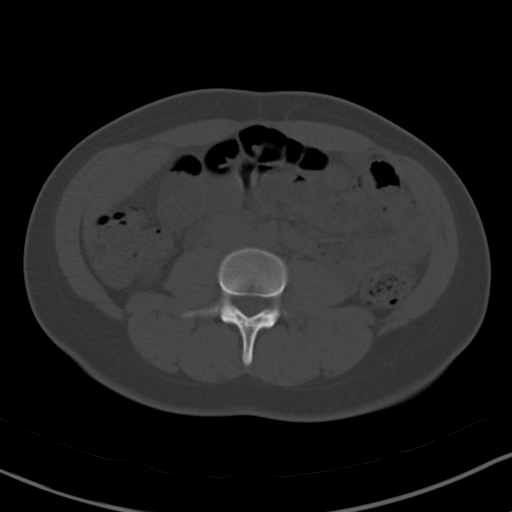
[im 54/77  soft-tissue]
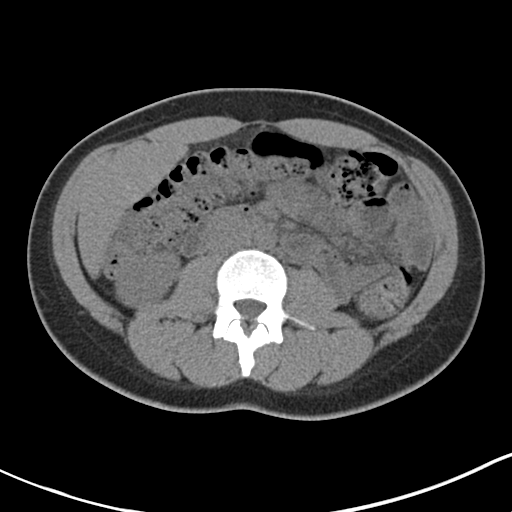
[im 61/77  soft-tissue]
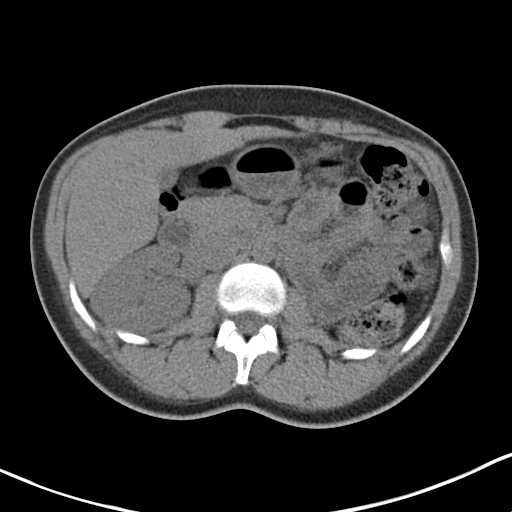
[im 67/77  soft-tissue]
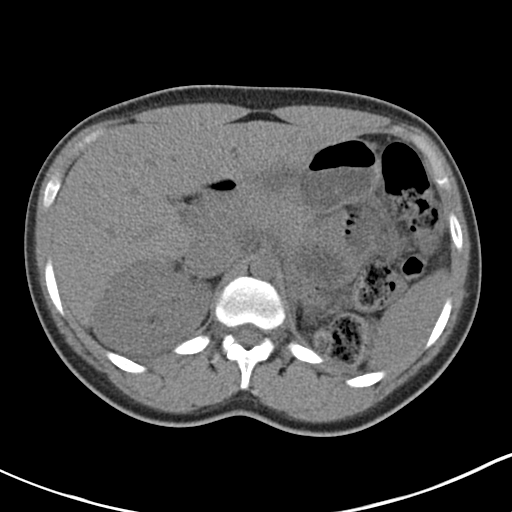
[im 73/77  soft-tissue]
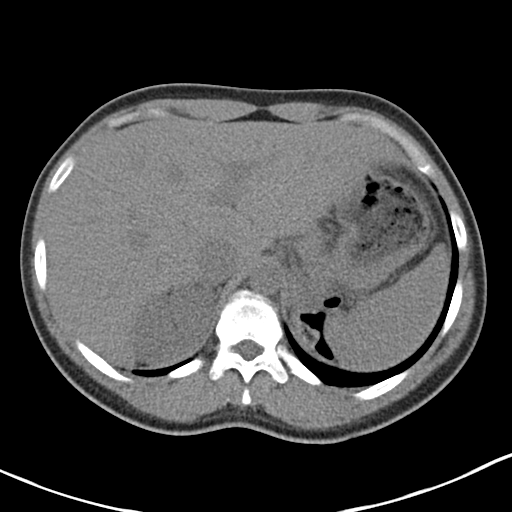

[Series 4: coronal · coronal · 0.64mm/px · 3 of 130 slices shown]
[im 44/130  soft-tissue]
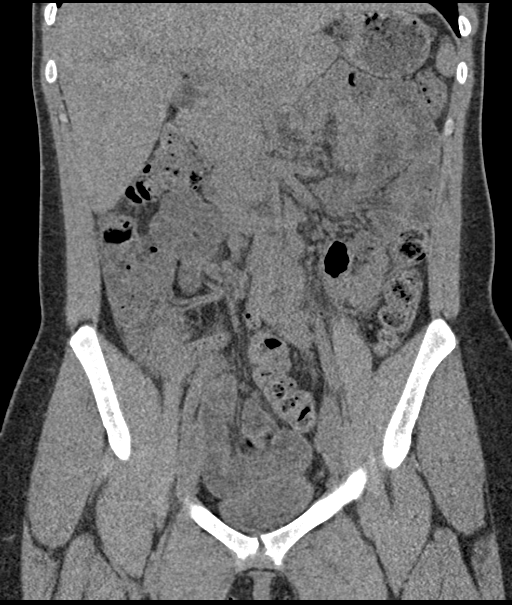
[im 58/130  soft-tissue]
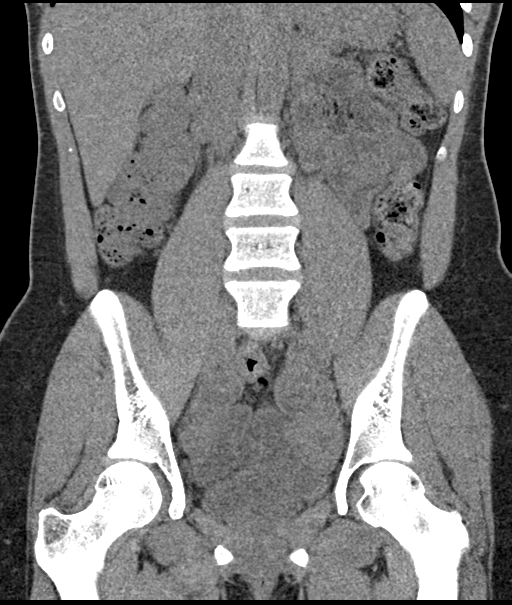
[im 72/130  soft-tissue]
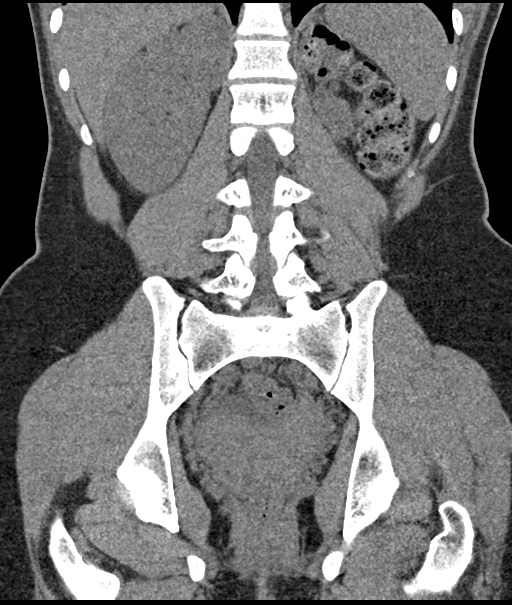

[16 of 46 positions shown; findings below may reference images not displayed]

FINDINGS: Lower chest: No acute abnormality.

Hepatobiliary: Visualized liver is unremarkable. Gallbladder
unremarkable. No intra or extrahepatic biliary ductal dilation.

Pancreas: Unremarkable.

Spleen: Visualized spleen is unremarkable.

Adrenals/Urinary Tract: The adrenal glands are unremarkable. Right
kidney is unremarkable. Left kidney is absent. Bladder is
unremarkable.

Stomach/Bowel: Stomach is within normal limits. Appendix appears
normal. No evidence of bowel wall thickening, distention, or
inflammatory changes.

Vascular/Lymphatic: No significant vascular findings are present. No
enlarged abdominal or pelvic lymph nodes.

Reproductive: The uterine configuration is unchanged from prior
examination suggesting a bicornuate uterus. No adnexal masses.

Other: No abdominal wall hernia or abnormality. No abdominopelvic
ascites.

Musculoskeletal: No acute bone abnormality. No lytic or blastic bone
lesion.
IMPRESSION: No acute intra-abdominal pathology identified. No definite
radiographic explanation for the patient's reported symptoms.

Absence of the left kidney again noted.

Bike are no it appearance of the uterus. This is not well assessed
due to noncontrast technique and was was better assessed on prior
examination where this was felt to represent a bicornuate uterus
versus is unicornuate uterus with rudimentary horn configuration.

## 2022-10-25 IMAGING — CT CT ABD-PELV W/ CM
2 of 4 series · 16 of 46 positions shown, 18 images · IV contrast (agent unspecified)
Comparison: 02/13/2022

CLINICAL DATA: Abdominal pain, acute nonlocalized

EXAM:
CT ABDOMEN AND PELVIS WITH CONTRAST
TECHNIQUE: Multidetector CT imaging of the abdomen and pelvis was performed
using the standard protocol following bolus administration of
intravenous contrast.

[Series 2: axial st · axial · 0.71mm/px · z∈[-368,+2]mm · 13 of 84 slices shown, 15 images]
[im 5/84  soft-tissue]
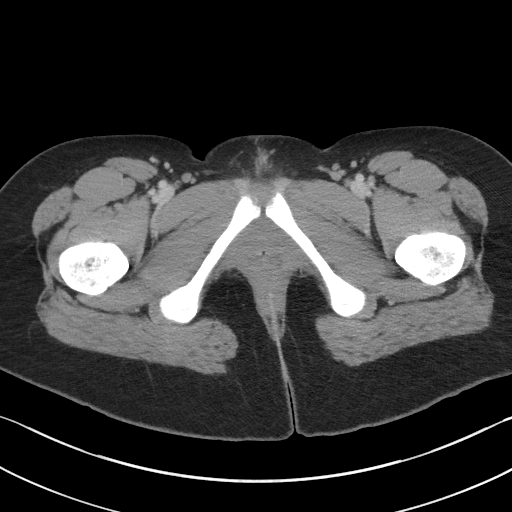
[im 5/84  bone]
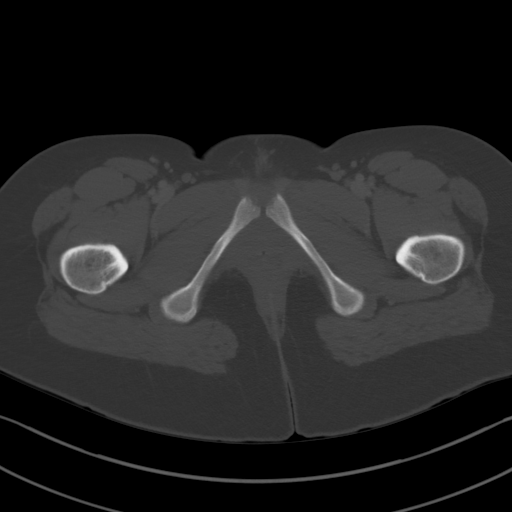
[im 14/84  soft-tissue]
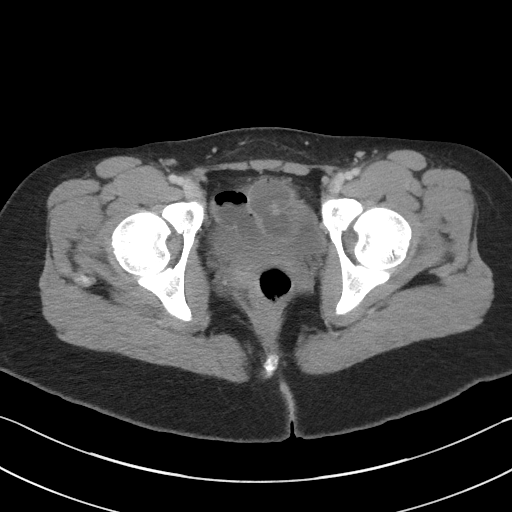
[im 18/84  soft-tissue]
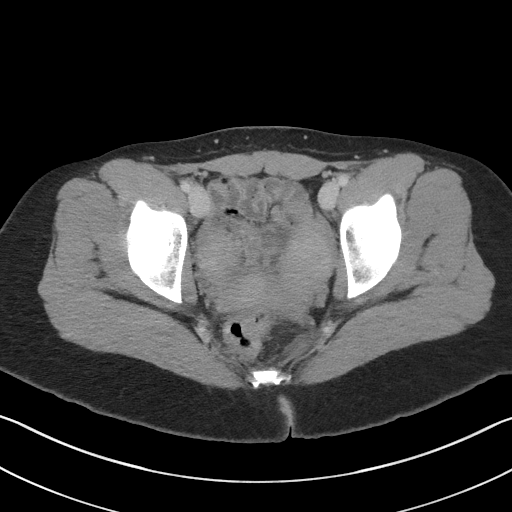
[im 22/84  soft-tissue]
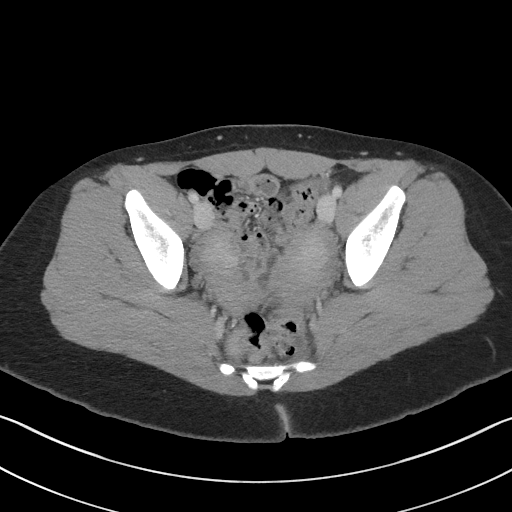
[im 31/84  soft-tissue]
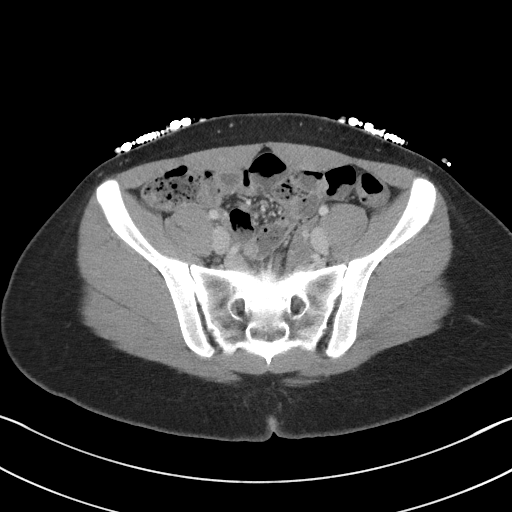
[im 35/84  soft-tissue]
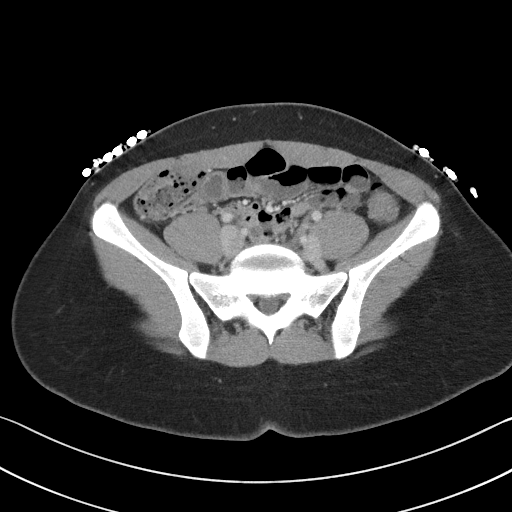
[im 44/84  soft-tissue]
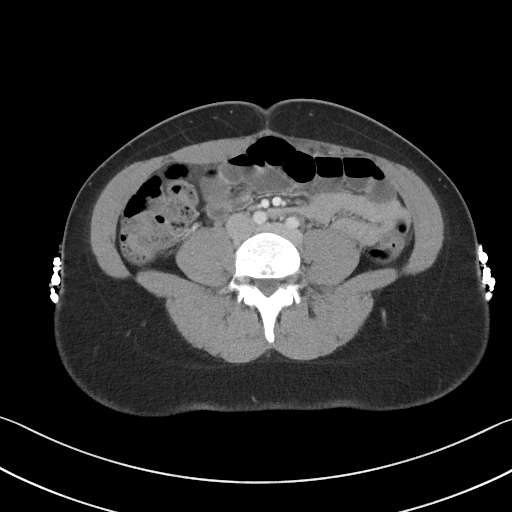
[im 49/84  soft-tissue]
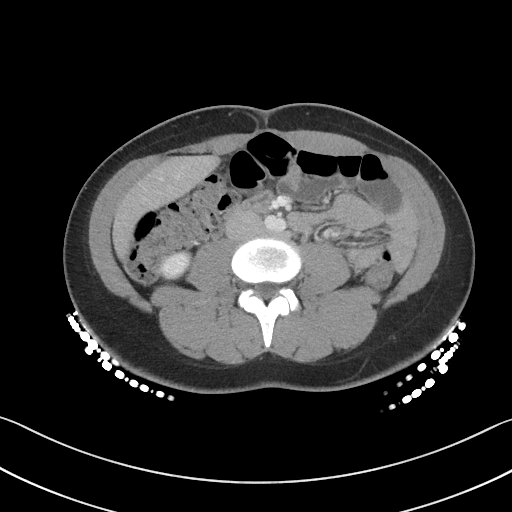
[im 53/84  soft-tissue]
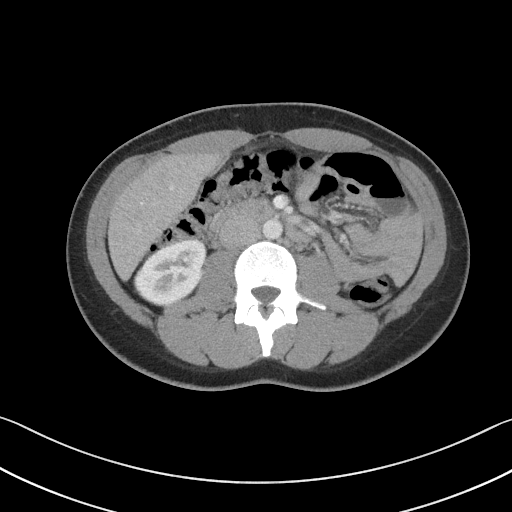
[im 53/84  bone]
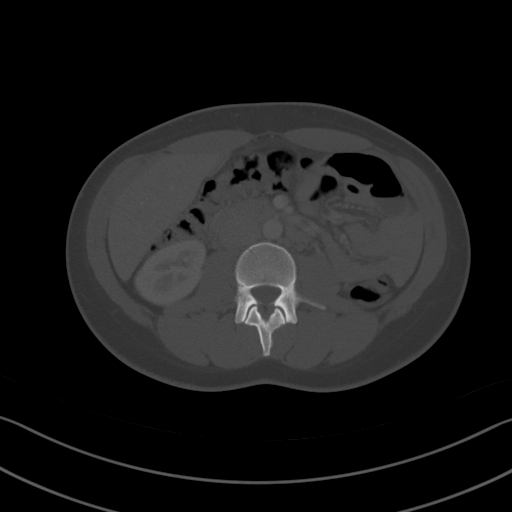
[im 62/84  soft-tissue]
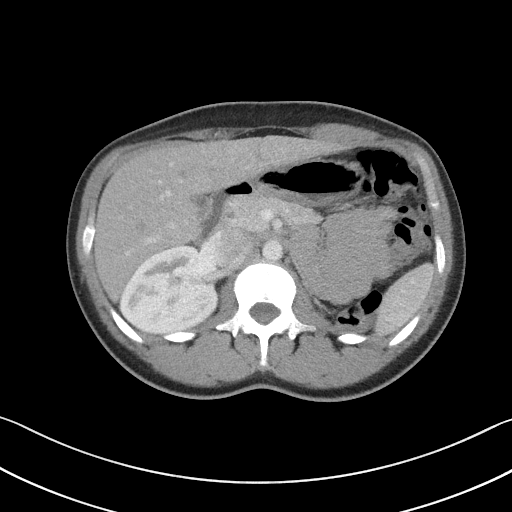
[im 66/84  soft-tissue]
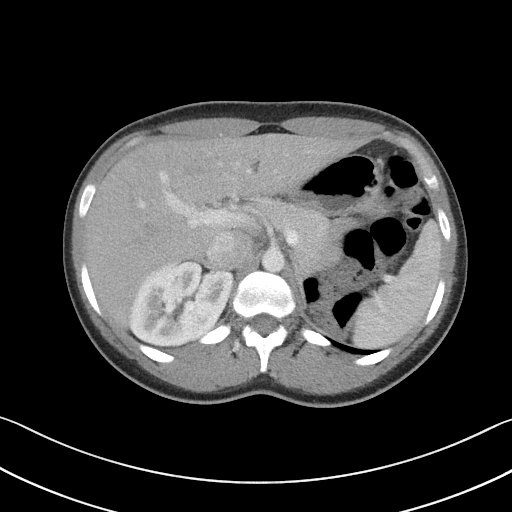
[im 70/84  soft-tissue]
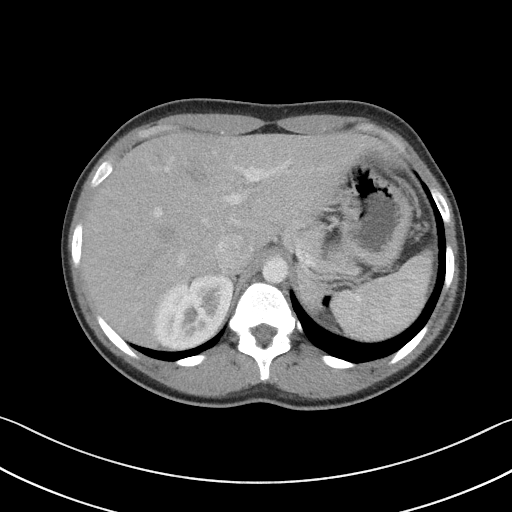
[im 79/84  soft-tissue]
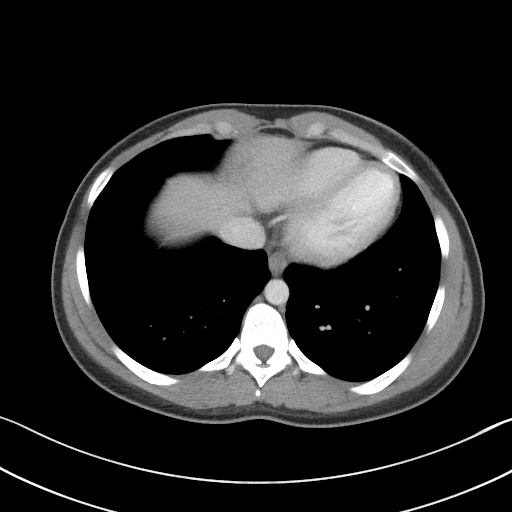

[Series 5: coronal st · coronal · 0.68mm/px · 3 of 74 slices shown]
[im 25/74  soft-tissue]
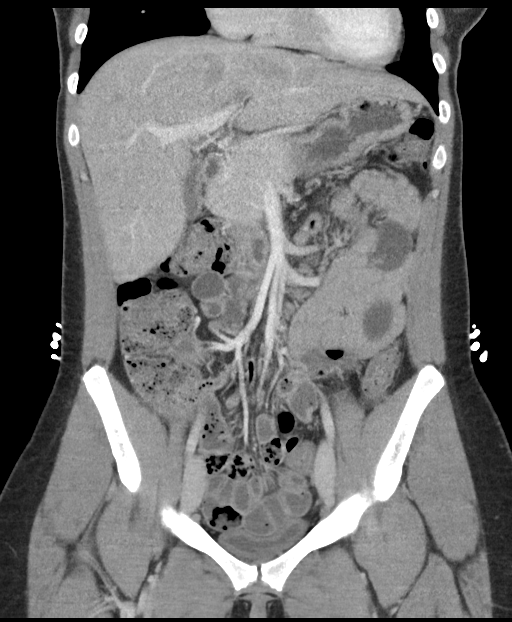
[im 33/74  soft-tissue]
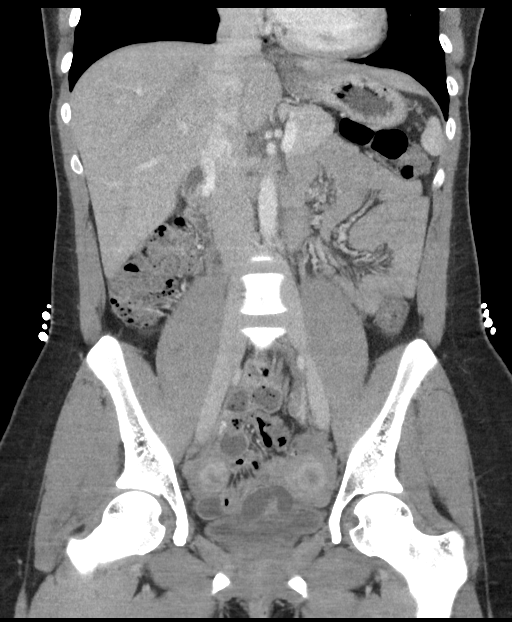
[im 41/74  soft-tissue]
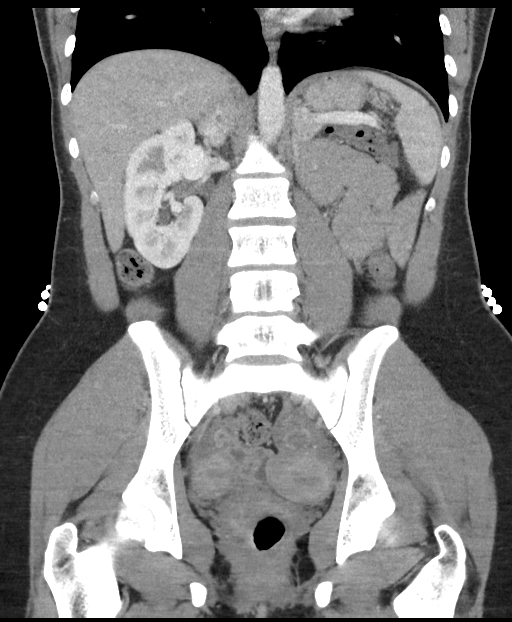

[16 of 46 positions shown; findings below may reference images not displayed]

RADIATION DOSE REDUCTION: This exam was performed according to the
departmental dose-optimization program which includes automated
exposure control, adjustment of the mA and/or kV according to
patient size and/or use of iterative reconstruction technique.

CONTRAST:  100mL OMNIPAQUE IOHEXOL 300 MG/ML  SOLN
FINDINGS: Lower chest: No acute abnormality.

Hepatobiliary: No focal liver abnormality is seen. No gallstones,
gallbladder wall thickening, or biliary dilatation.

Pancreas: Unremarkable

Spleen: Unremarkable

Adrenals/Urinary Tract: The adrenal glands are unremarkable. The
right kidney is normal. Left kidney is absent. The bladder is
unremarkable.

Stomach/Bowel: Stomach, small bowel, and large bowel are
unremarkable. Appendix normal. No free intraperitoneal gas or fluid.

Vascular/Lymphatic: No significant vascular findings are present. No
enlarged abdominal or pelvic lymph nodes.

Reproductive: Bicornuate uterus again noted. Pelvic organs are
otherwise unremarkable.

Other: No abdominal wall hernia or abnormality. No abdominopelvic
ascites.

Musculoskeletal: No acute or significant osseous findings.
IMPRESSION: No acute intra-abdominal pathology identified. No definite
radiographic explanation for the patient's reported symptoms.

Congenital absence of the left kidney.

Bicornuate uterus.

## 2023-09-23 ENCOUNTER — Other Ambulatory Visit: Payer: Self-pay | Admitting: Obstetrics and Gynecology

## 2024-01-05 ENCOUNTER — Other Ambulatory Visit: Payer: Self-pay | Admitting: Obstetrics and Gynecology

## 2024-01-27 ENCOUNTER — Encounter (HOSPITAL_COMMUNITY): Payer: Self-pay | Admitting: Obstetrics and Gynecology

## 2024-01-27 NOTE — Progress Notes (Signed)
 Spoke w/ via phone for pre-op interview--- Courtney Quinn needs dos----  CBC, RPR, and T&S per surgeon. UPT per anesthesia.       Quinn results------ COVID test -----patient states asymptomatic no test needed Arrive at -------1100 NPO after MN NO Solid Food.  Clear liquids from MN until---1000 Pre-Surgery Ensure or G2:  Med rec completed Medications to take morning of surgery -----Norethindrone Diabetic medication -----  GLP1 agonist last dose: GLP1 instructions:  Patient instructed no nail polish to be worn day of surgery Patient instructed to bring photo id and insurance card day of surgery Patient aware to have Driver (ride ) / caregiver    for 24 hours after surgery - Husband Courtney Quinn Patient Special Instructions ----- Shower with antibacterial soap. Pre-Op special Instructions -----  Patient verbalized understanding of instructions that were given at this phone interview. Patient denies chest pain, sob, fever, cough at the interview.

## 2024-02-04 ENCOUNTER — Encounter (HOSPITAL_COMMUNITY)
Admission: RE | Disposition: A | Discharge status: Home / Self Care | Payer: Self-pay | Source: Home / Self Care | Attending: Obstetrics and Gynecology

## 2024-02-04 ENCOUNTER — Other Ambulatory Visit: Payer: Self-pay

## 2024-02-04 ENCOUNTER — Encounter (HOSPITAL_COMMUNITY): Payer: Self-pay | Admitting: Obstetrics and Gynecology

## 2024-02-04 ENCOUNTER — Ambulatory Visit (HOSPITAL_COMMUNITY): Admitting: Certified Registered Nurse Anesthetist

## 2024-02-04 ENCOUNTER — Ambulatory Visit (HOSPITAL_COMMUNITY)
Admission: RE | Admit: 2024-02-04 | Discharge: 2024-02-04 | Disposition: A | Discharge status: Home / Self Care | Payer: BC Managed Care – PPO | Attending: Obstetrics and Gynecology | Admitting: Obstetrics and Gynecology

## 2024-02-04 ENCOUNTER — Ambulatory Visit (HOSPITAL_BASED_OUTPATIENT_CLINIC_OR_DEPARTMENT_OTHER): Admitting: Certified Registered Nurse Anesthetist

## 2024-02-04 DIAGNOSIS — N857 Hematometra: Secondary | ICD-10-CM | POA: Insufficient documentation

## 2024-02-04 DIAGNOSIS — E119 Type 2 diabetes mellitus without complications: Secondary | ICD-10-CM | POA: Insufficient documentation

## 2024-02-04 DIAGNOSIS — Q514 Unicornate uterus: Secondary | ICD-10-CM

## 2024-02-04 DIAGNOSIS — N809 Endometriosis, unspecified: Secondary | ICD-10-CM

## 2024-02-04 DIAGNOSIS — Z01818 Encounter for other preprocedural examination: Secondary | ICD-10-CM

## 2024-02-04 HISTORY — PX: CHROMOPERTUBATION: SHX6288

## 2024-02-04 HISTORY — PX: LAPAROSCOPY, DIAGNOSTIC, ROBOT-ASSISTED: SHX7606

## 2024-02-04 HISTORY — PX: ROBOTIC ASSISTED LAPAROSCOPIC LYSIS OF ADHESION: SHX6080

## 2024-02-04 LAB — POCT PREGNANCY, URINE: Preg Test, Ur: NEGATIVE

## 2024-02-04 LAB — GLUCOSE, CAPILLARY: Glucose-Capillary: 91 mg/dL (ref 70–99)

## 2024-02-04 LAB — CBC
HCT: 42.7 % (ref 36.0–46.0)
Hemoglobin: 13.8 g/dL (ref 12.0–15.0)
MCH: 31.9 pg (ref 26.0–34.0)
MCHC: 32.3 g/dL (ref 30.0–36.0)
MCV: 98.6 fL (ref 80.0–100.0)
Platelets: 342 10*3/uL (ref 150–400)
RBC: 4.33 MIL/uL (ref 3.87–5.11)
RDW: 11.9 % (ref 11.5–15.5)
WBC: 7.7 10*3/uL (ref 4.0–10.5)
nRBC: 0 % (ref 0.0–0.2)

## 2024-02-04 LAB — TYPE AND SCREEN
ABO/RH(D): O POS
Antibody Screen: NEGATIVE

## 2024-02-04 LAB — ABO/RH: ABO/RH(D): O POS

## 2024-02-04 SURGERY — LYSIS, ADHESIONS, ROBOT-ASSISTED, LAPAROSCOPIC
Anesthesia: General | Site: Pelvis

## 2024-02-04 MED ORDER — PHENYLEPHRINE 80 MCG/ML (10ML) SYRINGE FOR IV PUSH (FOR BLOOD PRESSURE SUPPORT)
PREFILLED_SYRINGE | INTRAVENOUS | Status: AC
Start: 2024-02-04 — End: ?
  Filled 2024-02-04: qty 10

## 2024-02-04 MED ORDER — OXYCODONE HCL 5 MG PO TABS
5.0000 mg | ORAL_TABLET | Freq: Once | ORAL | Status: AC | PRN
  Administered 2024-02-04: 5 mg via ORAL

## 2024-02-04 MED ORDER — FENTANYL CITRATE (PF) 100 MCG/2ML IJ SOLN
25.0000 ug | INTRAMUSCULAR | Status: DC | PRN
  Administered 2024-02-04 (×2): 50 ug via INTRAVENOUS

## 2024-02-04 MED ORDER — HYDROMORPHONE HCL 1 MG/ML IJ SOLN
INTRAMUSCULAR | Status: DC
Start: 2024-02-04 — End: 2024-02-04
  Filled 2024-02-04: qty 1

## 2024-02-04 MED ORDER — CEFAZOLIN SODIUM-DEXTROSE 2-3 GM-%(50ML) IV SOLR
INTRAVENOUS | Status: DC | PRN
Start: 2024-02-04 — End: 2024-02-04
  Administered 2024-02-04: 2 g via INTRAVENOUS

## 2024-02-04 MED ORDER — ONDANSETRON HCL 4 MG/2ML IJ SOLN
INTRAMUSCULAR | Status: AC
  Filled 2024-02-04: qty 2

## 2024-02-04 MED ORDER — MIDAZOLAM HCL 2 MG/2ML IJ SOLN
INTRAMUSCULAR | Status: AC
  Filled 2024-02-04: qty 2

## 2024-02-04 MED ORDER — EPHEDRINE SULFATE-NACL 50-0.9 MG/10ML-% IV SOSY
PREFILLED_SYRINGE | INTRAVENOUS | Status: DC | PRN
Start: 2024-02-04 — End: 2024-02-04
  Administered 2024-02-04 (×3): 5 mg via INTRAVENOUS

## 2024-02-04 MED ORDER — METHYLENE BLUE (ANTIDOTE) 1 % IV SOLN
INTRAVENOUS | Status: AC
  Filled 2024-02-04: qty 10

## 2024-02-04 MED ORDER — MIDAZOLAM HCL 5 MG/5ML IJ SOLN
INTRAMUSCULAR | Status: DC | PRN
  Administered 2024-02-04: 2 mg via INTRAVENOUS

## 2024-02-04 MED ORDER — DEXAMETHASONE SODIUM PHOSPHATE 10 MG/ML IJ SOLN
INTRAMUSCULAR | Status: AC
  Filled 2024-02-04: qty 1

## 2024-02-04 MED ORDER — SODIUM CHLORIDE 0.9 % IR SOLN
Status: DC | PRN
  Administered 2024-02-04: 1000 mL

## 2024-02-04 MED ORDER — ROCURONIUM BROMIDE 10 MG/ML (PF) SYRINGE
PREFILLED_SYRINGE | INTRAVENOUS | Status: AC
  Filled 2024-02-04: qty 10

## 2024-02-04 MED ORDER — DEXAMETHASONE SODIUM PHOSPHATE 10 MG/ML IJ SOLN
INTRAMUSCULAR | Status: DC | PRN
  Administered 2024-02-04: 10 mg via INTRAVENOUS

## 2024-02-04 MED ORDER — PROPOFOL 10 MG/ML IV BOLUS
INTRAVENOUS | Status: DC | PRN
  Administered 2024-02-04: 140 mg via INTRAVENOUS

## 2024-02-04 MED ORDER — HYDROMORPHONE HCL 1 MG/ML IJ SOLN
INTRAMUSCULAR | Status: AC
  Filled 2024-02-04: qty 1

## 2024-02-04 MED ORDER — SCOPOLAMINE 1 MG/3DAYS TD PT72
MEDICATED_PATCH | TRANSDERMAL | Status: AC
  Administered 2024-02-04: 1.5 mg
  Filled 2024-02-04: qty 1

## 2024-02-04 MED ORDER — ROCURONIUM BROMIDE 10 MG/ML (PF) SYRINGE
PREFILLED_SYRINGE | INTRAVENOUS | Status: DC | PRN
  Administered 2024-02-04: 20 mg via INTRAVENOUS
  Administered 2024-02-04: 10 mg via INTRAVENOUS
  Administered 2024-02-04: 50 mg via INTRAVENOUS
  Administered 2024-02-04: 10 mg via INTRAVENOUS

## 2024-02-04 MED ORDER — SUGAMMADEX SODIUM 200 MG/2ML IV SOLN
INTRAVENOUS | Status: DC | PRN
  Administered 2024-02-04: 150 mg via INTRAVENOUS

## 2024-02-04 MED ORDER — BUPIVACAINE-EPINEPHRINE (PF) 0.25% -1:200000 IJ SOLN
INTRAMUSCULAR | Status: AC
  Filled 2024-02-04: qty 30

## 2024-02-04 MED ORDER — SODIUM CHLORIDE (PF) 0.9 % IJ SOLN
INTRAMUSCULAR | Status: DC | PRN
  Administered 2024-02-04: 5 mL

## 2024-02-04 MED ORDER — SODIUM CHLORIDE (PF) 0.9 % IJ SOLN
INTRAMUSCULAR | Status: AC
  Filled 2024-02-04: qty 10

## 2024-02-04 MED ORDER — POVIDONE-IODINE 10 % EX SWAB: 2.0000 | Freq: Once | CUTANEOUS | Status: DC

## 2024-02-04 MED ORDER — CHLORHEXIDINE GLUCONATE 0.12 % MT SOLN
15.0000 mL | Freq: Once | OROMUCOSAL | Status: AC
  Administered 2024-02-04: 15 mL via OROMUCOSAL

## 2024-02-04 MED ORDER — ACETAMINOPHEN 500 MG PO TABS
ORAL_TABLET | ORAL | Status: AC
  Filled 2024-02-04: qty 2

## 2024-02-04 MED ORDER — FENTANYL CITRATE (PF) 100 MCG/2ML IJ SOLN
INTRAMUSCULAR | Status: AC
  Filled 2024-02-04: qty 2

## 2024-02-04 MED ORDER — HYDROMORPHONE HCL 1 MG/ML IJ SOLN
0.2500 mg | INTRAMUSCULAR | Status: DC | PRN
  Administered 2024-02-04 (×4): 0.5 mg via INTRAVENOUS

## 2024-02-04 MED ORDER — MIDAZOLAM HCL 2 MG/2ML IJ SOLN: 0.5000 mg | Freq: Once | INTRAMUSCULAR | Status: DC | PRN

## 2024-02-04 MED ORDER — SODIUM CHLORIDE 0.9 % IV SOLN: INTRAVENOUS | Status: DC

## 2024-02-04 MED ORDER — LIDOCAINE 2% (20 MG/ML) 5 ML SYRINGE
INTRAMUSCULAR | Status: DC | PRN
  Administered 2024-02-04: 60 mg via INTRAVENOUS

## 2024-02-04 MED ORDER — CHLORHEXIDINE GLUCONATE 0.12 % MT SOLN
OROMUCOSAL | Status: AC
  Filled 2024-02-04: qty 15

## 2024-02-04 MED ORDER — LIDOCAINE 2% (20 MG/ML) 5 ML SYRINGE
INTRAMUSCULAR | Status: AC
  Filled 2024-02-04: qty 5

## 2024-02-04 MED ORDER — ORAL CARE MOUTH RINSE: 15.0000 mL | Freq: Once | OROMUCOSAL | Status: AC

## 2024-02-04 MED ORDER — CEFAZOLIN SODIUM-DEXTROSE 2-4 GM/100ML-% IV SOLN
INTRAVENOUS | Status: DC
Start: 2024-02-04 — End: 2024-02-04
  Filled 2024-02-04: qty 100

## 2024-02-04 MED ORDER — KETOROLAC TROMETHAMINE 30 MG/ML IJ SOLN
INTRAMUSCULAR | Status: AC
  Filled 2024-02-04: qty 1

## 2024-02-04 MED ORDER — FENTANYL CITRATE (PF) 100 MCG/2ML IJ SOLN
INTRAMUSCULAR | Status: DC | PRN
  Administered 2024-02-04: 50 ug via INTRAVENOUS
  Administered 2024-02-04: 100 ug via INTRAVENOUS
  Administered 2024-02-04 (×2): 50 ug via INTRAVENOUS

## 2024-02-04 MED ORDER — BUPIVACAINE-EPINEPHRINE 0.25% -1:200000 IJ SOLN
INTRAMUSCULAR | Status: DC | PRN
  Administered 2024-02-04: 20 mL

## 2024-02-04 MED ORDER — MEPERIDINE HCL 25 MG/ML IJ SOLN: 6.2500 mg | INTRAMUSCULAR | Status: DC | PRN

## 2024-02-04 MED ORDER — KETOROLAC TROMETHAMINE 30 MG/ML IJ SOLN
INTRAMUSCULAR | Status: DC | PRN
  Administered 2024-02-04: 30 mg via INTRAVENOUS

## 2024-02-04 MED ORDER — DEXMEDETOMIDINE HCL IN NACL 80 MCG/20ML IV SOLN
INTRAVENOUS | Status: DC | PRN
  Administered 2024-02-04: 8 ug via INTRAVENOUS
  Administered 2024-02-04: 4 ug via INTRAVENOUS
  Administered 2024-02-04: 8 ug via INTRAVENOUS

## 2024-02-04 MED ORDER — PHENYLEPHRINE 80 MCG/ML (10ML) SYRINGE FOR IV PUSH (FOR BLOOD PRESSURE SUPPORT)
PREFILLED_SYRINGE | INTRAVENOUS | Status: DC | PRN
  Administered 2024-02-04: 80 ug via INTRAVENOUS

## 2024-02-04 MED ORDER — 0.9 % SODIUM CHLORIDE (POUR BTL) OPTIME
TOPICAL | Status: DC | PRN
  Administered 2024-02-04: 1000 mL

## 2024-02-04 MED ORDER — ONDANSETRON HCL 4 MG PO TABS: 4.0000 mg | ORAL_TABLET | Freq: Every day | ORAL | 1 refills | Status: AC | PRN

## 2024-02-04 MED ORDER — OXYCODONE HCL 5 MG PO TABS
ORAL_TABLET | ORAL | Status: AC
  Filled 2024-02-04: qty 1

## 2024-02-04 MED ORDER — ONDANSETRON HCL 4 MG/2ML IJ SOLN
INTRAMUSCULAR | Status: DC | PRN
  Administered 2024-02-04: 4 mg via INTRAVENOUS

## 2024-02-04 MED ORDER — VASOPRESSIN 20 UNIT/ML IV SOLN
INTRAVENOUS | Status: AC
Start: 2024-02-04 — End: ?
  Filled 2024-02-04: qty 1

## 2024-02-04 MED ORDER — FENTANYL CITRATE (PF) 250 MCG/5ML IJ SOLN
INTRAMUSCULAR | Status: AC
  Filled 2024-02-04: qty 5

## 2024-02-04 MED ORDER — METHYLENE BLUE (ANTIDOTE) 1 % IV SOLN
INTRAVENOUS | Status: DC | PRN
  Administered 2024-02-04: 1 mL

## 2024-02-04 MED ORDER — SODIUM CHLORIDE (PF) 0.9 % IJ SOLN
INTRAMUSCULAR | Status: AC
  Filled 2024-02-04: qty 150

## 2024-02-04 MED ORDER — SCOPOLAMINE 1 MG/3DAYS TD PT72: 1.0000 | MEDICATED_PATCH | TRANSDERMAL | Status: DC

## 2024-02-04 MED ORDER — ACETAMINOPHEN 500 MG PO TABS
1000.0000 mg | ORAL_TABLET | Freq: Once | ORAL | Status: AC
  Administered 2024-02-04: 1000 mg via ORAL

## 2024-02-04 MED ORDER — OXYCODONE HCL 5 MG/5ML PO SOLN: 5.0000 mg | Freq: Once | ORAL | Status: AC | PRN

## 2024-02-04 MED ORDER — PROPOFOL 10 MG/ML IV BOLUS
INTRAVENOUS | Status: AC
  Filled 2024-02-04: qty 20

## 2024-02-04 SURGICAL SUPPLY — 60 items
BAG COUNTER SPONGE SURGICOUNT (BAG) ×2 IMPLANT
BARRIER ADHS 3X4 INTERCEED (GAUZE/BANDAGES/DRESSINGS) IMPLANT
CANISTER SUCT 3000ML PPV (MISCELLANEOUS) ×2 IMPLANT
CATH FOLEY 3WAY 5CC 16FR (CATHETERS) ×2 IMPLANT
COVER BACK TABLE 60X90IN (DRAPES) ×4 IMPLANT
COVER TIP SHEARS 8 DVNC (MISCELLANEOUS) ×2 IMPLANT
DEFOGGER SCOPE WARM SEASHARP (MISCELLANEOUS) ×2 IMPLANT
DERMABOND ADVANCED .7 DNX12 (GAUZE/BANDAGES/DRESSINGS) ×2 IMPLANT
DRAPE ARM DVNC X/XI (DISPOSABLE) ×6 IMPLANT
DRAPE COLUMN DVNC XI (DISPOSABLE) ×2 IMPLANT
DRAPE SURG IRRIG POUCH 19X23 (DRAPES) ×2 IMPLANT
DRIVER NDL MEGA SUTCUT DVNCXI (INSTRUMENTS) ×2 IMPLANT
DRIVER NDLE MEGA SUTCUT DVNCXI (INSTRUMENTS) IMPLANT
DRSG OPSITE POSTOP 3X4 (GAUZE/BANDAGES/DRESSINGS) ×2 IMPLANT
DRSG TELFA 3X8 NADH STRL (GAUZE/BANDAGES/DRESSINGS) IMPLANT
DURAPREP 26ML APPLICATOR (WOUND CARE) ×2 IMPLANT
ELECTRODE REM PT RTRN 9FT ADLT (ELECTROSURGICAL) ×2 IMPLANT
FORCEPS BPLR 8 MD DVNC XI (FORCEP) IMPLANT
GAUZE 4X4 16PLY ~~LOC~~+RFID DBL (SPONGE) ×2 IMPLANT
GLOVE BIO SURGEON STRL SZ8 (GLOVE) ×6 IMPLANT
GLOVE BIOGEL PI IND STRL 7.0 (GLOVE) ×4 IMPLANT
GLOVE BIOGEL PI IND STRL 8.5 (GLOVE) ×6 IMPLANT
IRRIGATION SUCT STRKRFLW 2 WTP (MISCELLANEOUS) ×2 IMPLANT
KIT PINK PAD W/HEAD ARE REST (MISCELLANEOUS) ×2 IMPLANT
KIT PINK PAD W/HEAD ARM REST (MISCELLANEOUS) IMPLANT
LEGGING LITHOTOMY PAIR STRL (DRAPES) IMPLANT
MANIPULATOR UTERINE 4.5 ZUMI (MISCELLANEOUS) ×2 IMPLANT
MORCELLATOR XCISE COR (MISCELLANEOUS) IMPLANT
NDL 18GX1X1/2 (RX/OR ONLY) (NEEDLE) IMPLANT
NDL HYPO 22X1.5 SAFETY MO (MISCELLANEOUS) IMPLANT
NDL INSUFFLATION 14GA 120MM (NEEDLE) ×2 IMPLANT
NEEDLE 18GX1X1/2 (RX/OR ONLY) (NEEDLE) ×2 IMPLANT
NEEDLE HYPO 22X1.5 SAFETY MO (MISCELLANEOUS) ×2 IMPLANT
NEEDLE INSUFFLATION 14GA 120MM (NEEDLE) ×2 IMPLANT
NS IRRIG 1000ML POUR BTL (IV SOLUTION) ×6 IMPLANT
OBTURATOR OPTICALSTD 8 DVNC (TROCAR) IMPLANT
OCCLUDER COLPOPNEUMO (BALLOONS) IMPLANT
PACK ROBOT WH (CUSTOM PROCEDURE TRAY) ×2 IMPLANT
PACK ROBOTIC GOWN (GOWN DISPOSABLE) ×2 IMPLANT
PAD OB MATERNITY 11 LF (PERSONAL CARE ITEMS) ×2 IMPLANT
SCISSORS MNPLR CVD DVNC XI (INSTRUMENTS) ×2 IMPLANT
SEAL UNIV 5-12 XI (MISCELLANEOUS) ×6 IMPLANT
SEPRAFILM MEMBRANE 5X6 (MISCELLANEOUS) IMPLANT
SET IRRIG Y TYPE TUR BLADDER L (SET/KITS/TRAYS/PACK) IMPLANT
SET TRI-LUMEN FLTR TB AIRSEAL (TUBING) IMPLANT
SHEARS HARMONIC ACE PLUS 36CM (ENDOMECHANICALS) IMPLANT
SPIKE FLUID TRANSFER (MISCELLANEOUS) ×4 IMPLANT
SUT PLAIN ABS 2-0 CT1 27XMFL (SUTURE) IMPLANT
SUT VICRYL 0 UR6 27IN ABS (SUTURE) IMPLANT
SYR 50ML SLIP (SYRINGE) IMPLANT
SYR 5ML LL (SYRINGE) IMPLANT
SYR CONTROL 10ML LL (SYRINGE) IMPLANT
SYSTEM CONTND EXTRCTN KII BLLN (MISCELLANEOUS) IMPLANT
TIP UTERINE 5.1X6CM LAV DISP (MISCELLANEOUS) IMPLANT
TIP UTERINE 6.7X10CM GRN DISP (MISCELLANEOUS) IMPLANT
TIP UTERINE 6.7X6CM WHT DISP (MISCELLANEOUS) IMPLANT
TIP UTERINE 6.7X8CM BLUE DISP (MISCELLANEOUS) IMPLANT
TOWEL GREEN STERILE FF (TOWEL DISPOSABLE) ×6 IMPLANT
UNDERPAD 30X36 HEAVY ABSORB (UNDERPADS AND DIAPERS) ×2 IMPLANT
WATER STERILE IRR 1000ML POUR (IV SOLUTION) ×2 IMPLANT

## 2024-02-04 NOTE — Anesthesia Preprocedure Evaluation (Addendum)
 Anesthesia Evaluation  Patient identified by MRN, date of birth, ID band Patient awake    Reviewed: Allergy & Precautions, NPO status , Patient's Chart, lab work & pertinent test results  History of Anesthesia Complications Negative for: history of anesthetic complications  Airway Mallampati: I  TM Distance: >3 FB Neck ROM: Full    Dental  (+) Dental Advisory Given   Pulmonary neg pulmonary ROS   breath sounds clear to auscultation       Cardiovascular negative cardio ROS  Rhythm:Regular Rate:Normal     Neuro/Psych negative neurological ROS     GI/Hepatic negative GI ROS, Neg liver ROS,,,  Endo/Other  diabetes (diet controlled)    Renal/GU Solitary kidney     Musculoskeletal   Abdominal   Peds  Hematology negative hematology ROS (+)   Anesthesia Other Findings   Reproductive/Obstetrics                             Anesthesia Physical Anesthesia Plan  ASA: 2  Anesthesia Plan: General   Post-op Pain Management: Tylenol  PO (pre-op)*   Induction: Intravenous  PONV Risk Score and Plan: 3 and Ondansetron , Dexamethasone and Scopolamine patch - Pre-op  Airway Management Planned: Oral ETT  Additional Equipment: None  Intra-op Plan:   Post-operative Plan: Extubation in OR  Informed Consent: I have reviewed the patients History and Physical, chart, labs and discussed the procedure including the risks, benefits and alternatives for the proposed anesthesia with the patient or authorized representative who has indicated his/her understanding and acceptance.     Dental advisory given  Plan Discussed with: CRNA and Surgeon  Anesthesia Plan Comments:         Anesthesia Quick Evaluation

## 2024-02-04 NOTE — H&P (Signed)
 Courtney Quinn is a 23 y.o. female , originally referred to me by Dr. 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 Menstrual and endo. Suppression was attempted but pt wants to conceive and cannot tolerate cyclic pelcic pain.  Patient would like to preserve her childbearing potential.  Pertinent Gynecological History: Menses: flow is excessive with use of 3 pads or tampons on heaviest days Bleeding: dysfunctional uterine bleeding Contraception: none DES exposure: denies Blood transfusions: none Sexually transmitted diseases: no past history Previous GYN Procedures:    Last pap: normal  OB History: G:0   Menstrual History: Menarche age: 44 No LMP recorded.    Past Medical History:  Diagnosis Date   Abnormal menstrual periods    Renal disorder    Born with 1 kidney                    Past Surgical History:  Procedure Laterality Date   HYSTEROSCOPY               History reviewed. No pertinent family history. No hereditary disease.  No cancer of breast, ovary, uterus. No cutaneous leiomyomatosis or renal cell carcinoma.  Social History   Socioeconomic History   Marital status: Married    Spouse name: Not on file   Number of children: Not on file   Years of education: Not on file   Highest education level: Not on file  Occupational History   Not on file  Tobacco Use   Smoking status: Never   Smokeless tobacco: Never  Vaping Use   Vaping status: Former   Substances: Nicotine  Substance and Sexual Activity   Alcohol use: Never   Drug use: Not Currently    Types: Marijuana    Comment: stopped 1 year ago   Sexual activity: Not on file  Other Topics Concern   Not on file  Social History Narrative   Not on file   Social Drivers of Health   Financial Resource Strain: Medium Risk (12/24/2023)   Received from [facility]   Overall Financial Resource Strain (CARDIA)    Difficulty of  Paying Living Expenses: Somewhat hard  Food Insecurity: No Food Insecurity (12/24/2023)   Received from [facility]   Hunger Vital Sign    Worried About Running Out of Food in the Last Year: Never true    Ran Out of Food in the Last Year: Never true  Transportation Needs: No Transportation Needs (12/24/2023)   Received from [facility] - Transportation    Lack of Transportation (Medical): No    Lack of Transportation (Non-Medical): No  Physical Activity: Unknown (12/24/2023)   Received from [facility]   Exercise Vital Sign    Days of Exercise per Week: 0 days    Minutes of Exercise per Session: Not on file  Stress: No Stress Concern Present (12/24/2023)   Received from [facility] - Occupational Stress Questionnaire    Feeling of Stress : Not at all  Social Connections: Moderately Integrated (12/24/2023)   Received from [facility]   Social Network    How would you rate your social network (family, work, friends)?: Adequate participation with social networks  Intimate Partner Violence: Not At Risk (12/24/2023)   Received from [facility]   HITS    Over the last 12 months how often did your partner physically hurt you?: Never    Over  the last 12 months how often did your partner insult you or talk down to you?: Never    Over the last 12 months how often did your partner threaten you with physical harm?: Never    Over the last 12 months how often did your partner scream or curse at you?: Never    Allergies  Allergen Reactions   Pork-Derived Products     No current facility-administered medications on file prior to encounter.   Current Outpatient Medications on File Prior to Encounter  Medication Sig Dispense Refill   Multiple Vitamin (MULTIVITAMIN WITH MINERALS) TABS tablet Take 1 tablet by mouth daily.     norethindrone (AYGESTIN) 5 MG tablet Take 5 mg by mouth daily.     Omega-3 Fatty Acids (FISH OIL) 1000 MG CAPS  Take by mouth.     cephALEXin  (KEFLEX ) 500 MG capsule Take 1 capsule (500 mg total) by mouth 4 (four) times daily. 28 capsule 0   ondansetron  (ZOFRAN ) 4 MG tablet Take 1 tablet (4 mg total) by mouth every 6 (six) hours. (Patient taking differently: Take 4 mg by mouth every 6 (six) hours. NOT TAKING) 12 tablet 0   ondansetron  (ZOFRAN -ODT) 8 MG disintegrating tablet 8mg  ODT q4 hours prn nausea (Patient taking differently: 8mg  ODT q4 hours prn nausea  NOT TAKING) 10 tablet 0   traMADol  (ULTRAM ) 50 MG tablet Take 1 tablet (50 mg total) by mouth every 6 (six) hours as needed. (Patient taking differently: Take 50 mg by mouth every 6 (six) hours as needed. NOT TAKING) 10 tablet 0     Review of Systems  Constitutional: Negative.   HENT: Negative.   Eyes: Negative.   Respiratory: Negative.   Cardiovascular: Negative.   Gastrointestinal: Negative.   Genitourinary: Negative.   Musculoskeletal: Negative.   Skin: Negative.   Neurological: Negative.   Endo/Heme/Allergies: Negative.   Psychiatric/Behavioral: Negative.      Physical Exam  BP 128/85   Pulse (!) 55   Temp 98.1 F (36.7 C) (Oral)   Resp 17   Ht 5\' 4"  (1.626 m)   Wt 66.7 kg   LMP 12/04/2023 (Exact Date)   SpO2 97%   BMI 25.23 kg/m  Constitutional: She is oriented to person, place, and time. She appears well-developed and well-nourished.  HENT:  Head: Normocephalic and atraumatic.  Nose: Nose normal.  Mouth/Throat: Oropharynx is clear and moist. No oropharyngeal exudate.  Eyes: Conjunctivae normal and EOM are normal. Pupils are equal, round, and reactive to light. No scleral icterus.  Neck: Normal range of motion. Neck supple. No tracheal deviation present. No thyromegaly present.  Cardiovascular: Normal rate.   Respiratory: Effort normal and breath sounds normal.  GI: Soft. Bowel sounds are normal. She exhibits no distension and no mass. There is no tenderness.  Lymphadenopathy:    She has no cervical adenopathy.   Neurological: She is alert and oriented to person, place, and time. She has normal reflexes.  Skin: Skin is warm.  Psychiatric: She has a normal mood and affect. Her behavior is normal. Judgment and thought content normal.    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  Bowel prep instructions were given.  All of patient's questions were answered.  She verbalized understanding.  Devonda Pequignot, MD

## 2024-02-04 NOTE — Transfer of Care (Signed)
 Immediate Anesthesia Transfer of Care Note  Patient: Courtney Quinn  Procedure(s) Performed: XI ROBOTIC ASSISTED LAPAROSCOPIC LYSIS OF ADHESION; REMOVAL OF ENDOMETRIOSIS (Pelvis) XI ROBOTIC ASSISTED REMOVAL OF RUDIMENTARY HORN AND LEFT SALPINGECTOMY (Pelvis) CHROMOPERTUBATION, FALLOPIAN TUBE (Pelvis)  Patient Location: PACU  Anesthesia Type:General  Level of Consciousness: drowsy  Airway & Oxygen Therapy: Patient Spontanous Breathing and Patient connected to nasal cannula oxygen  Post-op Assessment: Report given to RN and Post -op Vital signs reviewed and stable  Post vital signs: Reviewed and stable  Last Vitals:  Vitals Value Taken Time  BP 95/48 02/04/24 1636  Temp    Pulse 53 02/04/24 1640  Resp 12 02/04/24 1640  SpO2 99 % 02/04/24 1640  Vitals shown include unfiled device data.  Last Pain:  Vitals:   02/04/24 1145  TempSrc: Oral         Complications: No notable events documented.

## 2024-02-04 NOTE — Op Note (Signed)
 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 endotracheal  Surgeon: Asa Bjork  Assistant: Santana Cue, RNFA  Complications: None  Estimated blood loss: 50 mL, Specimens: Left uterus (rudimentary horn, left fallopian tube, peritoneal excisional biopsies to pathology.  Findings: On exam under anesthesia, external genitalia, Bartholin's, Skene's, and the urethra and vagina were normal.  There was 1 cervix which appeared grossly normal and was located to the right of the vaginal apex.  There was a bicornuate uterus palpable with normal size and mobility.  There were no adnexal masses palpable.  There was no posterior fornix nodularity palpable. On laparoscopy, there were hemosiderin pigmentation on the apex of the right Hemi diaphragm.  Liver and gallbladder appeared normal the appendix appeared normal.  The left hemidiaphragm appeared normal. The sigmoid colon mesentery was adherent all the way to the left infundibulopelvic ligament covering the axis of the left adnexa and it was lysed. There was diffuse nonpigmented lesions of endometriosis on the left side of the pelvic peritoneum more than the right side.  In addition both ovaries were covered with reddish loose adhesions consistent with endometriosis.  These were trimmed off and there were remnants were fulgurated with the side of the monopolar scissors and cutting current mode. There were filmy adhesions  around the left tube. The right tube appeared grossly normal with 5 out of 5 fimbria.  It was patent to chromotubation. 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 There was a peritoneal pocket in the posterior cul-de-sac but inside it there were no lesions of endometriosis.  Description of the procedure: Patient was placed in lithotomy position and general endotracheal anesthesia was given.  2 g of cefazolin were given intravenously for prophylaxis. She was prepped and draped in sterile manner. A Foley catheter was inserted into the bladder. A ZUMI uterine manipulator was inserted into the right uterus for uterine manipulation and chromotubation. The uterus sounded to 8 cm.   An operative field was created on the abdomen and the surgeon was regloved. After preemptive anesthesia with quarter percent bupivacaine and intraumbilical skin incision was made.  A Verress needle was inserted and pneumoperitoneum was created with carbon dioxide. An 8 mm trocar was placed at this incision. Robotic laparoscope with 3-D camera was inserted and, under direct visualization, 2 left lateral 5 and 8mm incisions were made and corresponding robotic trochar and assistant port were placed. On the right side a third robotic trocar was placed as well as a 8 mm assistant port. The [employer] XI robot was docked to the patient after placing her in Trendelenburg position. The surgeon continued the rest of the procedure from the surgical console.  We used monopolar scissors and cutting current mode first to take down the adhesions on the mesentery of the sigmoid colon and to free up the left filmy peritubal adhesions.  Next all of the superficial ovarian lesions were trimmed off and the bases were fulgurated with the side  of the scissors and cutting current  mode.  Next we applied the [place]  bipolar forceps on the mesosalpinx of the left tube and successively coagulated and cut remaining close to the tubal wall and extirpated the left tube.  Next the broad ligament lateral to the left Hemi uterus showing hematometra was successively applied and coagulated and cut.  At the level of the cardinal ligaments the left uterine artery branches were skeletonized and triply coagulated and cut with [place]  forceps, staying well away from the course of the left ureter and the caudal, blind ending tip of the left Hemi uterus was reached.  At this point the [place]  forceps was applied at the bifurcation of the 2 Hemi uterine resulting in complete extirpation of the left Hemi uterus.  The right lower quadrant incision was extended to 15 mm and a Ardith Bedford morcellator bag was introduced and the uterus was placed into this and the bag was exteriorized.  With the application of the morcellator the specimen was divided and the bag was removed with the rest of the specimen.   This was copiously irrigated and aspirated hemostasis was ensured.  Instrument and lap pad count was correct x 2.   The resulting fascia defect was closed with 0 Vicryl continuous suture.  0 Vicryl continuous figure-of-eight suture was placed on the umbilical fascia.  The rest of the incisions were closed with 4-0 Monocryl in subcuticular sutures.  Dermabond was applied to the skin.  Pelvis was copiously irrigated with lactated Ringer solution and a slurry of Seprafilm (2 sheets in 40 mL of lactated Ringer solution) was instilled into the pelvis as an adhesion barrier. The umbilical incision and the 12 mm assistant port incisions were closed with 0 Vicryl figure-of-eight sutures. The skin incisions were approximated with Dermabond.    Special Note: This patient can have a vaginal delivery since the integrity of the right uterus was not compromised during the procedure.  Malee Grays, MD

## 2024-02-04 NOTE — Anesthesia Procedure Notes (Signed)
 Procedure Name: Intubation Date/Time: 02/04/2024 2:05 PM  Performed by: Ezzie Holstein, CRNAPre-anesthesia Checklist: Patient identified, Emergency Drugs available, Suction available and Patient being monitored Patient Re-evaluated:Patient Re-evaluated prior to induction Oxygen Delivery Method: Circle System Utilized Preoxygenation: Pre-oxygenation with 100% oxygen Induction Type: IV induction Ventilation: Mask ventilation without difficulty Laryngoscope Size: Mac and 3 Grade View: Grade I Tube type: Oral Tube size: 7.0 mm Number of attempts: 1 Airway Equipment and Method: Stylet Placement Confirmation: ETT inserted through vocal cords under direct vision, positive ETCO2 and breath sounds checked- equal and bilateral Secured at: 22 cm Tube secured with: Tape Dental Injury: Teeth and Oropharynx as per pre-operative assessment

## 2024-02-05 ENCOUNTER — Encounter (HOSPITAL_COMMUNITY): Payer: Self-pay | Admitting: Obstetrics and Gynecology

## 2024-02-05 NOTE — Anesthesia Postprocedure Evaluation (Signed)
 Anesthesia Post Note  Patient: Courtney Quinn  Procedure(s) Performed: XI ROBOTIC ASSISTED LAPAROSCOPIC LYSIS OF ADHESION; REMOVAL OF ENDOMETRIOSIS (Pelvis) XI ROBOTIC ASSISTED REMOVAL OF RUDIMENTARY HORN AND LEFT SALPINGECTOMY (Pelvis) CHROMOPERTUBATION, FALLOPIAN TUBE (Pelvis)     Patient location during evaluation: PACU Anesthesia Type: General Level of consciousness: awake and alert Pain management: pain level controlled Vital Signs Assessment: post-procedure vital signs reviewed and stable Respiratory status: spontaneous breathing, nonlabored ventilation, respiratory function stable and patient connected to nasal cannula oxygen Cardiovascular status: blood pressure returned to baseline and stable Postop Assessment: no apparent nausea or vomiting Anesthetic complications: no   No notable events documented.  Last Vitals:  Vitals:   02/04/24 1745 02/04/24 1800  BP: (!) 118/58 106/61  Pulse: 68 67  Resp: 20 17  Temp:  36.6 C  SpO2: 97% 93%    Last Pain:  Vitals:   02/04/24 1800  TempSrc:   PainSc: 2                  Leslye Rast

## 2024-02-06 LAB — SURGICAL PATHOLOGY
